# Patient Record
Sex: Female | Born: 1975 | Race: Black or African American | Hispanic: No | Marital: Married | Smoking: Never smoker
Health system: Southern US, Community
[De-identification: ages and names within clinical notes are randomized; demographics above are authoritative.]

## PROBLEM LIST (undated history)

## (undated) DIAGNOSIS — M329 Systemic lupus erythematosus, unspecified: Secondary | ICD-10-CM

## (undated) DIAGNOSIS — F32A Depression, unspecified: Secondary | ICD-10-CM

## (undated) DIAGNOSIS — E669 Obesity, unspecified: Secondary | ICD-10-CM

## (undated) DIAGNOSIS — F329 Major depressive disorder, single episode, unspecified: Secondary | ICD-10-CM

## (undated) DIAGNOSIS — I10 Essential (primary) hypertension: Secondary | ICD-10-CM

## (undated) HISTORY — DX: Systemic lupus erythematosus, unspecified: M32.9

## (undated) HISTORY — DX: Essential (primary) hypertension: I10

## (undated) HISTORY — DX: Obesity, unspecified: E66.9

## (undated) HISTORY — DX: Depression, unspecified: F32.A

## (undated) HISTORY — PX: ANKLE SURGERY: SHX546

---
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
## 2008-12-10 ENCOUNTER — Emergency Department (HOSPITAL_COMMUNITY): Admission: EM | Admit: 2008-12-10 | Discharge: 2008-12-11 | Payer: Self-pay | Admitting: Emergency Medicine

## 2010-05-06 ENCOUNTER — Emergency Department (HOSPITAL_COMMUNITY): Admission: EM | Admit: 2010-05-06 | Discharge: 2010-05-06 | Payer: Self-pay | Admitting: Emergency Medicine

## 2010-05-07 ENCOUNTER — Inpatient Hospital Stay (HOSPITAL_COMMUNITY): Admission: AD | Admit: 2010-05-07 | Discharge: 2010-05-09 | Payer: Self-pay | Admitting: Orthopaedic Surgery

## 2010-11-19 LAB — URINALYSIS, ROUTINE W REFLEX MICROSCOPIC
Glucose, UA: NEGATIVE mg/dL
Nitrite: NEGATIVE
Protein, ur: NEGATIVE mg/dL
Urobilinogen, UA: 1 mg/dL (ref 0.0–1.0)
pH: 6 (ref 5.0–8.0)

## 2010-11-19 LAB — CBC
HCT: 37.9 % (ref 36.0–46.0)
Hemoglobin: 12.9 g/dL (ref 12.0–15.0)
Platelets: 195 10*3/uL (ref 150–400)
RBC: 4.07 MIL/uL (ref 3.87–5.11)
RDW: 12.7 % (ref 11.5–15.5)

## 2010-11-19 LAB — DIFFERENTIAL
Basophils Absolute: 0 10*3/uL (ref 0.0–0.1)
Basophils Relative: 0 % (ref 0–1)
Eosinophils Relative: 1 % (ref 0–5)
Lymphs Abs: 1.5 10*3/uL (ref 0.7–4.0)
Monocytes Absolute: 0.9 10*3/uL (ref 0.1–1.0)
Monocytes Relative: 12 % (ref 3–12)

## 2010-11-19 LAB — COMPREHENSIVE METABOLIC PANEL
ALT: 44 U/L — ABNORMAL HIGH (ref 0–35)
Alkaline Phosphatase: 56 U/L (ref 39–117)
BUN: 8 mg/dL (ref 6–23)
CO2: 25 mEq/L (ref 19–32)
Chloride: 107 mEq/L (ref 96–112)
Creatinine, Ser: 0.8 mg/dL (ref 0.4–1.2)
GFR calc Af Amer: >60 mL/min (ref >60)
Total Bilirubin: 1.1 mg/dL (ref 0.3–1.2)

## 2010-11-19 LAB — PROTIME-INR
INR: 0.95 (ref 0.00–1.49)
Prothrombin Time: 12.9 seconds (ref 11.6–15.2)

## 2012-04-14 ENCOUNTER — Emergency Department (HOSPITAL_COMMUNITY)
Admission: EM | Admit: 2012-04-14 | Discharge: 2012-04-14 | Disposition: A | Discharge status: Home / Self Care | Payer: BC Managed Care – PPO | Attending: Emergency Medicine | Admitting: Emergency Medicine

## 2012-04-14 ENCOUNTER — Encounter (HOSPITAL_COMMUNITY): Payer: Self-pay | Admitting: Emergency Medicine

## 2012-04-14 DIAGNOSIS — L03319 Cellulitis of trunk, unspecified: Secondary | ICD-10-CM | POA: Insufficient documentation

## 2012-04-14 DIAGNOSIS — L02211 Cutaneous abscess of abdominal wall: Secondary | ICD-10-CM

## 2012-04-14 DIAGNOSIS — L02219 Cutaneous abscess of trunk, unspecified: Secondary | ICD-10-CM | POA: Insufficient documentation

## 2012-04-14 MED ORDER — OXYCODONE-ACETAMINOPHEN 5-325 MG PO TABS: 1.0000 | ORAL_TABLET | ORAL | Status: AC | PRN

## 2012-04-14 MED ORDER — IBUPROFEN 800 MG PO TABS
800.0000 mg | ORAL_TABLET | Freq: Once | ORAL | Status: AC
  Administered 2012-04-14: 800 mg via ORAL
  Filled 2012-04-14: qty 1

## 2012-04-14 NOTE — ED Notes (Signed)
Has not taken BP med yet today -- brought with her, will take now.   Also states most antibiotics give yeast infection -

## 2012-04-14 NOTE — ED Provider Notes (Signed)
Medical screening examination/treatment/procedure(s) were performed by non-physician practitioner and as supervising physician I was immediately available for consultation/collaboration.  Flint Melter, MD 04/14/12 [phone]

## 2012-04-14 NOTE — ED Notes (Signed)
Pt presenting to ed with c/o abscess to lower abdomen x 4 days pt states pain is worse with walking.

## 2012-04-14 NOTE — ED Notes (Signed)
Went into obtain current VS, pt is currently having ID procedure done. Will complete VS upon physician finishing with pt

## 2012-04-14 NOTE — ED Provider Notes (Signed)
History     CSN: [id number]  Arrival date & time 04/14/12  7829   First MD Initiated Contact with Patient 04/14/12 [phone]      Chief Complaint  Patient presents with  . Abscess    (Consider location/radiation/quality/duration/timing/severity/associated sxs/prior treatment) Patient is a 36 y.o. female presenting with abscess. The history is provided by the patient.  Abscess  This is a new problem. The current episode started less than one week ago. The abscess is present on the abdomen. Pertinent negatives include no fever. Associated symptoms comments: Painful swelling to lower abdomen. No history of abscess. No drainage, fever, nausea. .    History reviewed. No pertinent past medical history.  Past Surgical History  Procedure Date  . Ankle surgery     No family history on file.  History  Substance Use Topics  . Smoking status: Never Smoker   . Smokeless tobacco: Not on file  . Alcohol Use: No    OB History    Grav Para Term Preterm Abortions TAB SAB Ect Mult Living                  Review of Systems  Constitutional: Negative for fever.  Musculoskeletal: Negative.   Skin:       See HPI.    Allergies  Review of patient's allergies indicates no known allergies.  Home Medications   Current Outpatient Rx  Name Route Sig Dispense Refill  . LISINOPRIL 10 MG PO TABS Oral Take 10 mg by mouth daily.      BP 172/107  Pulse 96  Temp 98.5 F (36.9 C) (Oral)  Resp 20  SpO2 100%  LMP 04/13/2012  Physical Exam  Constitutional: She is oriented to person, place, and time. She appears well-developed and well-nourished.  Abdominal: Soft. There is no tenderness. There is no rebound and no guarding.       Tender limited to focal area below umbilicus. See skin exam.  Neurological: She is alert and oriented to person, place, and time.  Skin:       Lower abdominal wall has area of redness measuring approximately 6 cm with central area of fluctuance. Findings c/w  cutaneous abscess.    ED Course  Procedures (including critical care time)  Labs Reviewed - No data to display No results found. INCISION AND DRAINAGE Performed by: Langley Adie A Consent: Verbal consent obtained. Risks and benefits: risks, benefits and alternatives were discussed Type: abscess  Body area: lower abdominal wall  Anesthesia: local infiltration  Local anesthetic: lidocaine 1% w/o epinephrine  Anesthetic total: 2 ml  Complexity: complex Blunt dissection to break up loculations  Drainage: purulent  Drainage amount: large  Packing material: 1/4 in iodoform gauze  Patient tolerance: Patient tolerated the procedure well with no immediate complications.     No diagnosis found. 1. Cutaneous abscess    MDM  Abscess  Opened and drained without complication. Patient instructed to return for 2 day recheck.        Rodena Medin, PA-C 04/14/12 1141

## 2019-05-30 ENCOUNTER — Ambulatory Visit: Payer: 59 | Admitting: Podiatry

## 2019-06-07 ENCOUNTER — Emergency Department (HOSPITAL_COMMUNITY)
Admission: EM | Admit: 2019-06-07 | Discharge: 2019-06-07 | Disposition: A | Discharge status: Home / Self Care | Payer: 59 | Attending: Emergency Medicine | Admitting: Emergency Medicine

## 2019-06-07 ENCOUNTER — Emergency Department (HOSPITAL_COMMUNITY): Payer: 59

## 2019-06-07 ENCOUNTER — Other Ambulatory Visit: Payer: Self-pay

## 2019-06-07 ENCOUNTER — Encounter (HOSPITAL_COMMUNITY): Payer: Self-pay | Admitting: Emergency Medicine

## 2019-06-07 DIAGNOSIS — I10 Essential (primary) hypertension: Secondary | ICD-10-CM

## 2019-06-07 DIAGNOSIS — R0602 Shortness of breath: Secondary | ICD-10-CM | POA: Diagnosis present

## 2019-06-07 DIAGNOSIS — Z79899 Other long term (current) drug therapy: Secondary | ICD-10-CM | POA: Insufficient documentation

## 2019-06-07 LAB — CBC WITH DIFFERENTIAL/PLATELET
Abs Immature Granulocytes: 0.01 10*3/uL (ref 0.00–0.07)
Basophils Absolute: 0 10*3/uL (ref 0.0–0.1)
Basophils Relative: 0 %
Eosinophils Absolute: 0.1 10*3/uL (ref 0.0–0.5)
Eosinophils Relative: 3 %
HCT: 37.5 % (ref 36.0–46.0)
Hemoglobin: 11.9 g/dL — ABNORMAL LOW (ref 12.0–15.0)
Immature Granulocytes: 0 %
Lymphocytes Relative: 27 %
Lymphs Abs: 1.1 10*3/uL (ref 0.7–4.0)
MCH: 31.4 pg (ref 26.0–34.0)
MCHC: 31.7 g/dL (ref 30.0–36.0)
MCV: 98.9 fL (ref 80.0–100.0)
Monocytes Absolute: 0.4 10*3/uL (ref 0.1–1.0)
Monocytes Relative: 9 %
Neutro Abs: 2.5 10*3/uL (ref 1.7–7.7)
Neutrophils Relative %: 61 %
Platelets: 225 10*3/uL (ref 150–400)
RBC: 3.79 MIL/uL — ABNORMAL LOW (ref 3.87–5.11)
RDW: 12.4 % (ref 11.5–15.5)
WBC: 4.1 10*3/uL (ref 4.0–10.5)
nRBC: 0 % (ref 0.0–0.2)

## 2019-06-07 LAB — BASIC METABOLIC PANEL
Anion gap: 3 — ABNORMAL LOW (ref 5–15)
BUN: 17 mg/dL (ref 6–20)
CO2: 30 mmol/L (ref 22–32)
Calcium: 8.7 mg/dL — ABNORMAL LOW (ref 8.9–10.3)
Chloride: 107 mmol/L (ref 98–111)
Creatinine, Ser: 0.73 mg/dL (ref 0.44–1.00)
GFR calc Af Amer: >60 mL/min (ref >60)
GFR calc non Af Amer: >60 mL/min (ref >60)
Glucose, Bld: 101 mg/dL — ABNORMAL HIGH (ref 70–99)
Potassium: 3.4 mmol/L — ABNORMAL LOW (ref 3.5–5.1)
Sodium: 140 mmol/L (ref 135–145)

## 2019-06-07 LAB — HCG, QUANTITATIVE, PREGNANCY: hCG, Beta Chain, Quant, S: <1 m[IU]/mL (ref <5)

## 2019-06-07 LAB — TROPONIN I (HIGH SENSITIVITY): Troponin I (High Sensitivity): 2 ng/L (ref <18)

## 2019-06-07 MED ORDER — HYDROCHLOROTHIAZIDE 25 MG PO TABS: 25.0000 mg | ORAL_TABLET | Freq: Every day | ORAL | 0 refills | Status: DC

## 2019-06-07 NOTE — ED Provider Notes (Signed)
[facility] EMERGENCY DEPARTMENT Provider Note   CSN: [id number] Arrival date & time: 06/07/19  1140     History   Chief Complaint Chief Complaint  Patient presents with   Shortness of Breath    HPI Samantha Bender is a 43 y.o. female with a history of hypertension, presenting for evaluation of shortness of breath, headache and chest pain.  She reports waking up around 3 AM last night with a sensation of shortness of breath without wheezing or chest pain.  She "made herself" go back to sleep, and she woke this morning around 9 AM she had another episode of feeling short of breath which lasted about 30 minutes then improved.  She does endorse having a generalized headache since last night as well.  She denies fevers or chills, nausea, vomiting, diaphoresis.  She also denies abdominal pain dizziness, focal weakness.  As she was driving here for evaluation she had a fleeting episode of right sided chest pressure which was fleeting and has resolved.  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  She does not smoke, she does have a family history of early cardiac disease in her father.  She denies COVID exposures, in fact was screened 4 days ago with a negative test.  She does endorse persistently elevated blood pressures despite being compliant with her lisinopril.  She has had no edema or pain in her lower extremities.  She is currently symptom-free.  HPI: A 43 year old patient with a history of hypertension and obesity presents for evaluation of chest pain. Initial onset of pain was approximately 3-6 hours ago. The patient's chest pain is sharp and is not worse with exertion. The patient's chest pain is not middle- or left-sided, is not well-localized, is not described as heaviness/pressure/tightness and does not radiate to the arms/jaw/neck. The patient does not complain of nausea and denies diaphoresis. The patient has a family history of coronary artery disease in a first-degree  relative with onset less than age 30. The patient has no history of stroke, has no history of peripheral artery disease, has not smoked in the past 90 days, denies any history of treated diabetes and has no history of hypercholesterolemia.   The history is provided by the patient.    History reviewed. No pertinent past medical history.  There are no active problems to display for this patient.   Past Surgical History:  Procedure Laterality Date   ANKLE SURGERY       OB History   No obstetric history on file.      Home Medications    Prior to Admission medications   Medication Sig Start Date End Date Taking? Authorizing Provider  ibuprofen (ADVIL) 800 MG tablet Take 800 mg by mouth every 8 (eight) hours as needed for cramping.   Yes [provider]  lisinopril (PRINIVIL,ZESTRIL) 10 MG tablet Take 40 mg by mouth daily.    Yes [provider]  hydrochlorothiazide (HYDRODIURIL) 25 MG tablet Take 1 tablet (25 mg total) by mouth daily. 06/07/19   Burgess Amor, PA-C    Family History No family history on file.  Social History Social History   Tobacco Use   Smoking status: Never Smoker   Smokeless tobacco: Never Used  Substance Use Topics   Alcohol use: No   Drug use: No     Allergies   Patient has no known allergies.   Review of Systems Review of Systems  Constitutional: Negative for chills and fever.  HENT: Negative for congestion and sore throat.   Eyes: Negative.   Respiratory: Positive for shortness of breath. Negative for cough, chest tightness and wheezing.   Cardiovascular: Positive for chest pain.  Gastrointestinal: Negative for abdominal pain, nausea and vomiting.  Genitourinary: Negative.   Musculoskeletal: Negative for arthralgias, joint swelling and neck pain.  Skin: Negative.  Negative for rash and wound.  Neurological: Negative for dizziness, weakness, light-headedness, numbness and headaches.  Psychiatric/Behavioral: Negative.       Physical Exam Updated Vital Signs BP (!) 172/117 (BP Location: Right Arm)    Pulse 73    Temp 98.2 F (36.8 C) (Oral)    Resp 16    Ht 5\' 6"  (1.676 m)    Wt 117.9 kg    LMP 05/31/2019    SpO2 99%    BMI 41.97 kg/m   Physical Exam Vitals signs and nursing note reviewed.  Constitutional:      General: She is not in acute distress.    Appearance: She is well-developed. She is obese.  HENT:     Head: Normocephalic and atraumatic.  Eyes:     Conjunctiva/sclera: Conjunctivae normal.  Neck:     Musculoskeletal: Normal range of motion.  Cardiovascular:     Rate and Rhythm: Normal rate and regular rhythm.     Heart sounds: Normal heart sounds.  Pulmonary:     Effort: Pulmonary effort is normal.     Breath sounds: Normal breath sounds. No decreased breath sounds, wheezing or rhonchi.  Abdominal:     General: Bowel sounds are normal.     Palpations: Abdomen is soft.     Tenderness: There is no abdominal tenderness.  Musculoskeletal: Normal range of motion.     Right lower leg: She exhibits no tenderness. No edema.     Left lower leg: She exhibits no tenderness. No edema.  Skin:    General: Skin is warm and dry.  Neurological:     General: No focal deficit present.     Mental Status: She is alert.      ED Treatments / Results  Labs (all labs ordered are listed, but only abnormal results are displayed) Labs Reviewed  BASIC METABOLIC PANEL - Abnormal; Notable for the following components:      Result Value   Potassium 3.4 (*)    Glucose, Bld 101 (*)    Calcium 8.7 (*)    Anion gap 3 (*)    All other components within normal limits  CBC WITH DIFFERENTIAL/PLATELET - Abnormal; Notable for the following components:   RBC 3.79 (*)    Hemoglobin 11.9 (*)    All other components within normal limits  HCG, QUANTITATIVE, PREGNANCY  TROPONIN I (HIGH SENSITIVITY)  TROPONIN I (HIGH SENSITIVITY)    EKG EKG Interpretation  Date/Time:  Thursday June 07 2019 12:02:13  EDT Ventricular Rate:  72 PR Interval:  182 QRS Duration: 68 QT Interval:  388 QTC Calculation: 424 R Axis:   56 Text Interpretation:  Normal sinus rhythm Low voltage QRS Possible Lateral infarct , age undetermined Abnormal ECG similar to prior 9/11 Confirmed by Aletta Edouard [phone]) on 06/07/2019 12:10:59 PM   Radiology Dg Chest 2 View  Result Date: 06/07/2019 CLINICAL DATA:  Shortness of breath EXAM: CHEST - 2 VIEW COMPARISON:  05/07/10 FINDINGS: The heart size and mediastinal contours are within normal limits. Both lungs are clear. The visualized skeletal structures are unremarkable. IMPRESSION: No active cardiopulmonary disease. Electronically Signed   By: Inez Catalina  M.D.   On: 06/07/2019 13:25    Procedures Procedures (including critical care time)  Medications Ordered in ED Medications - No data to display   Initial Impression / Assessment and Plan / ED Course  I have reviewed the triage vital signs and the nursing notes.  Pertinent labs & imaging results that were available during my care of the patient were reviewed by me and considered in my medical decision making (see chart for details).     HEAR Score: 2  Pt with low heart score and low troponin at 2 ng/L.  No indication for admission or delta troponin.  She has remained hypertensive here but sx free.  She has been compliant with her lisinopril.  Will add hctz for additional bp control.  Advised f/u with pcp (she reports appt in Nov), advised blood recheck within 1 week.    Final Clinical Impressions(s) / ED Diagnoses   Final diagnoses:  Essential hypertension    ED Discharge Orders         Ordered    hydrochlorothiazide (HYDRODIURIL) 25 MG tablet  Daily     06/07/19 1621           Burgess Amordol, Percy Winterrowd, PA-C 06/07/19 1632    Sabas SousBero, Michael M, MD 06/10/19 1250

## 2019-06-07 NOTE — ED Triage Notes (Signed)
Pt c/o of sob and headache since last night.  States its better but wants to be evaulated

## 2019-06-07 NOTE — Discharge Instructions (Addendum)
Your lab tests, ekg and chest xray are reassuring.  Your blood pressure has been elevated here and I suspect you would benefit from additional medicine to better control your pressure.  This is a diuretic medicine that will cause increased urination until your body adjusts.  It can also lower your bodies potassium level.  You can counteract this with diet - foods rich in potassium include bananas, orange juice and leafy green vegetables.

## 2019-07-11 ENCOUNTER — Other Ambulatory Visit: Payer: Self-pay

## 2019-07-11 ENCOUNTER — Ambulatory Visit
Admission: EM | Admit: 2019-07-11 | Discharge: 2019-07-11 | Disposition: A | Discharge status: Home / Self Care | Payer: 59 | Attending: Emergency Medicine | Admitting: Emergency Medicine

## 2019-07-11 ENCOUNTER — Ambulatory Visit (INDEPENDENT_AMBULATORY_CARE_PROVIDER_SITE_OTHER): Payer: 59

## 2019-07-11 DIAGNOSIS — S99922A Unspecified injury of left foot, initial encounter: Secondary | ICD-10-CM

## 2019-07-11 DIAGNOSIS — S92515A Nondisplaced fracture of proximal phalanx of left lesser toe(s), initial encounter for closed fracture: Secondary | ICD-10-CM

## 2019-07-11 MED ORDER — TRAMADOL HCL 50 MG PO TABS: 50.0000 mg | ORAL_TABLET | Freq: Two times a day (BID) | ORAL | 0 refills | Status: DC | PRN

## 2019-07-11 MED ORDER — NAPROXEN 500 MG PO TABS: 500.0000 mg | ORAL_TABLET | Freq: Two times a day (BID) | ORAL | 0 refills | Status: DC

## 2019-07-11 NOTE — ED Provider Notes (Signed)
[facility] CARE CENTER   [id number] 07/11/19 Arrival Time: 1312  CC: Left foot pain  SUBJECTIVE: History from: patient. Sorina Derrig is a 43 y.o. female complains of left foot/toe pain that began last night.  States foot got [employer] [place].  Localizes the pain to the top and outside of foot, as well as middle to outside toes.  Describes the pain as constant and throbbing in character.  Pain is 8/10.  Has tried OTC tylenol and wearing crocs with minimal relief.  Symptoms are made worse with bearing weight.  Denies similar symptoms in the past.  Complains of associated swelling and bruising. Denies fever, chills, erythema, ecchymosis, effusion, weakness, numbness and tingling     ROS: As per HPI.  All other pertinent ROS negative.     History reviewed. No pertinent past medical history. Past Surgical History:  Procedure Laterality Date  . ANKLE SURGERY     No Known Allergies No current facility-administered medications on file prior to encounter.    Current Outpatient Medications on File Prior to Encounter  Medication Sig Dispense Refill  . hydrochlorothiazide (HYDRODIURIL) 25 MG tablet Take 1 tablet (25 mg total) by mouth daily. 30 tablet 0  . ibuprofen (ADVIL) 800 MG tablet Take 800 mg by mouth every 8 (eight) hours as needed for cramping.    Marland Kitchen lisinopril (PRINIVIL,ZESTRIL) 10 MG tablet Take 40 mg by mouth daily.      Social History   Socioeconomic History  . Marital status: Married    Spouse name: Not on file  . Number of children: Not on file  . Years of education: Not on file  . Highest education level: Not on file  Occupational History  . Not on file  Social Needs  . Financial resource strain: Not on file  . Food insecurity    Worry: Not on file    Inability: Not on file  . Transportation needs    Medical: Not on file    Non-medical: Not on file  Tobacco Use  . Smoking status: Never Smoker  . Smokeless tobacco: Never Used  Substance and Sexual Activity   . Alcohol use: No  . Drug use: No  . Sexual activity: Not on file  Lifestyle  . Physical activity    Days per week: Not on file    Minutes per session: Not on file  . Stress: Not on file  Relationships  . Social Musician on phone: Not on file    Gets together: Not on file    Attends religious service: Not on file    Active member of club or organization: Not on file    Attends meetings of clubs or organizations: Not on file    Relationship status: Not on file  . Intimate partner violence    Fear of current or ex partner: Not on file    Emotionally abused: Not on file    Physically abused: Not on file    Forced sexual activity: Not on file  Other Topics Concern  . Not on file  Social History Narrative  . Not on file   Family History  Problem Relation Age of Onset  . Diabetes Mother   . Cancer Father     OBJECTIVE:  Vitals:   07/11/19 1321  BP: (!) 163/108  Pulse: 76  Resp: 20  Temp: 98.4 F (36.9 C)  SpO2: 95%    General appearance: ALERT; in no acute distress.  Head: NCAT Lungs: Normal  respiratory effort CV: Dorsalis pedis pulses 2+. Cap refill < 2 seconds Musculoskeletal: Left foot Inspection: Ecchymosis over 3rd - 5th digits of the left foot Palpation: TTP over distal 3rd proximal phalanx, and 3rd-5th distal phalanges ROM: FROM active and passive Strength: 5/5 dorsiflexion, 5/5 plantar flexion Skin: warm and dry Neurologic: Ambulates with minimal difficulty; Sensation intact about the lower extremities Psychological: alert and cooperative; normal mood and affect  DIAGNOSTIC STUDIES:  Dg Foot Complete Left  Result Date: 07/11/2019 CLINICAL DATA:  43 year old female with a history of foot injury EXAM: LEFT FOOT - COMPLETE 3+ VIEW COMPARISON:  05/06/2010 FINDINGS: Acute nondisplaced fracture of the proximal phalanx of the third digit. Remote surgical changes of prior open reduction internal fixation of distal tibia and fibula. No hardware  fracture. No unexpected radiopaque foreign body. No unexpected soft tissue density. Evidence of posttraumatic degenerative changes at the tibiotalar joint, not well evaluated on this foot plain film series. IMPRESSION: Acute nondisplaced fracture of the proximal phalanx of the third digit left foot. 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 Electronically Signed   By: Corrie Mckusick D.O.   On: 07/11/2019 13:48     X-rays positive for third proximal phalanx fracture.  Nondisplaced.    I have reviewed the x-rays myself and the radiologist interpretation. I am in agreement with the radiologist interpretation.     ASSESSMENT & PLAN:  1. Closed nondisplaced fracture of proximal phalanx of lesser toe of left foot, initial encounter   2. Injury of left foot, initial encounter   3. Injury of toe on left foot, initial encounter     Meds ordered this encounter  Medications  . naproxen (NAPROSYN) 500 MG tablet    Sig: Take 1 tablet (500 mg total) by mouth 2 (two) times daily.    Dispense:  30 tablet    Refill:  0    Order Specific Question:   Supervising Provider    Answer:   Raylene Everts [[id number]]  . traMADol (ULTRAM) 50 MG tablet    Sig: Take 1 tablet (50 mg total) by mouth every 12 (twelve) hours as needed for severe pain.    Dispense:  10 tablet    Refill:  0    Order Specific Question:   Supervising Provider    Answer:   Raylene Everts [[id number]]   X-rays did show third proximal phalanx fracture Continue conservative management of rest, ice, and elevation Post-op shoe and crutches given.  Wear post-op shoe until clear by orthopedist.  Use crutches as needed for comfort.  Progress weight-bearing activities as tolerated Take naproxen as needed for pain relief (may cause abdominal discomfort, ulcers, and GI bleeds avoid taking with other NSAIDs) Tramadol prescribed  for severe break-through pain.  DO NOT TAKE PRIOR TO driving or operating heavy machinery.   Follow up with orthopedist for further evaluation and management Return or go to the ER if you have any new or worsening symptoms (fever, chills, increased redness, swelling, bruising, symptoms do not improve with medications and treatment, etc...)   Reviewed expectations re: course of current medical issues. Questions answered. Outlined signs and symptoms indicating need for more acute intervention. Patient verbalized understanding. After Visit Summary given.    Lestine Box, PA-C 07/11/19 1357

## 2019-07-11 NOTE — Discharge Instructions (Addendum)
X-rays did show third proximal phalanx fracture Continue conservative management of rest, ice, and elevation Post-op shoe and crutches given.  Wear post-op shoe until clear by orthopedist.  Use crutches as needed for comfort.  Progress weight-bearing activities as tolerated Take naproxen as needed for pain relief (may cause abdominal discomfort, ulcers, and GI bleeds avoid taking with other NSAIDs) Tramadol prescribed for severe break-through pain.  DO NOT TAKE PRIOR TO driving or operating heavy machinery.   Follow up with orthopedist for further evaluation and management Return or go to the ER if you have any new or worsening symptoms (fever, chills, increased redness, swelling, bruising, symptoms do not improve with medications and treatment, etc...)

## 2019-07-11 NOTE — ED Triage Notes (Signed)
Pt presents  With c/o left toe injury from bumping on furniture last night, bruising noted

## 2019-07-12 ENCOUNTER — Telehealth: Payer: Self-pay | Admitting: Emergency Medicine

## 2019-07-12 MED ORDER — HYDROCODONE-ACETAMINOPHEN 5-325 MG PO TABS: 1.0000 | ORAL_TABLET | Freq: Two times a day (BID) | ORAL | 0 refills | Status: DC | PRN

## 2019-07-12 NOTE — Telephone Encounter (Signed)
Patient calls in requesting strong pain medication.  Tried taking two tramadol yesterday without relief.  Five norco sent to pharmacy on file.  Instructed to take for severe break-through pain.  Recommended to discard remaining tramadol.

## 2019-07-13 ENCOUNTER — Telehealth: Payer: Self-pay | Admitting: Urgent Care

## 2019-07-13 MED ORDER — HYDROCODONE-ACETAMINOPHEN 5-325 MG PO TABS: 1.0000 | ORAL_TABLET | Freq: Two times a day (BID) | ORAL | 0 refills | Status: DC | PRN

## 2019-07-13 NOTE — Telephone Encounter (Signed)
Patient called requesting hydrocodone be sent to [facility] in [place].  Per chart review patient was given this for pain from a metatarsal fracture.  Will send prescription now.  [facility] database reviewed and has not filled hydrocodone anywhere else from prescription sent previously by [facility].

## 2019-08-07 ENCOUNTER — Ambulatory Visit
Admission: EM | Admit: 2019-08-07 | Discharge: 2019-08-07 | Disposition: A | Discharge status: Home / Self Care | Payer: 59 | Attending: Emergency Medicine | Admitting: Emergency Medicine

## 2019-08-07 ENCOUNTER — Other Ambulatory Visit: Payer: Self-pay

## 2019-08-07 DIAGNOSIS — R234 Changes in skin texture: Secondary | ICD-10-CM

## 2019-08-07 DIAGNOSIS — R030 Elevated blood-pressure reading, without diagnosis of hypertension: Secondary | ICD-10-CM

## 2019-08-07 MED ORDER — DOXYCYCLINE HYCLATE 100 MG PO CAPS: 100.0000 mg | ORAL_CAPSULE | Freq: Two times a day (BID) | ORAL | 0 refills | Status: DC

## 2019-08-07 NOTE — ED Triage Notes (Signed)
Pt states she has areas of large bumps that has developed on buttocks  At panty line

## 2019-08-07 NOTE — ED Provider Notes (Signed)
[place]   [id number] 08/07/19 Arrival Time: 0973   CC: ABSCESS  SUBJECTIVE:  Shantoya Geurts is a 43 y.o. female who presents with a possible abscess of her buttock x few weeks ago.  Symptoms began after going to the beach.  Reports mild pain with sitting, but denies itching.  Denies aggravating or alleviating factors. Denies previous symptoms in the past.  Denies fever, chills, nausea, vomiting, erythema, swelling, discharge, odor.    ROS: As per HPI.  All other pertinent ROS negative.     History reviewed. No pertinent past medical history. Past Surgical History:  Procedure Laterality Date  . ANKLE SURGERY     No Known Allergies No current facility-administered medications on file prior to encounter.    Current Outpatient Medications on File Prior to Encounter  Medication Sig Dispense Refill  . hydrochlorothiazide (HYDRODIURIL) 25 MG tablet Take 1 tablet (25 mg total) by mouth daily. 30 tablet 0  . ibuprofen (ADVIL) 800 MG tablet Take 800 mg by mouth every 8 (eight) hours as needed for cramping.    Marland Kitchen lisinopril (PRINIVIL,ZESTRIL) 10 MG tablet Take 40 mg by mouth daily.     . naproxen (NAPROSYN) 500 MG tablet Take 1 tablet (500 mg total) by mouth 2 (two) times daily. 30 tablet 0   Social History   Socioeconomic History  . Marital status: Married    Spouse name: Not on file  . Number of children: Not on file  . Years of education: Not on file  . Highest education level: Not on file  Occupational History  . Not on file  Social Needs  . Financial resource strain: Not on file  . Food insecurity    Worry: Not on file    Inability: Not on file  . Transportation needs    Medical: Not on file    Non-medical: Not on file  Tobacco Use  . Smoking status: Never Smoker  . Smokeless tobacco: Never Used  Substance and Sexual Activity  . Alcohol use: No  . Drug use: No  . Sexual activity: Not on file  Lifestyle  . Physical activity    Days per week: Not on file    Minutes per session: Not on file  . Stress: Not on file  Relationships  . Social Herbalist on phone: Not on file    Gets together: Not on file    Attends religious service: Not on file    Active member of club or organization: Not on file    Attends meetings of clubs or organizations: Not on file    Relationship status: Not on file  . Intimate partner violence    Fear of current or ex partner: Not on file    Emotionally abused: Not on file    Physically abused: Not on file    Forced sexual activity: Not on file  Other Topics Concern  . Not on file  Social History Narrative  . Not on file   Family History  Problem Relation Age of Onset  . Diabetes Mother   . Cancer Father     OBJECTIVE:  Vitals:   08/07/19 1024  BP: (!) 167/105  Pulse: 69  Resp: 20  Temp: 98.4 F (36.9 C)  SpO2: 96%     General appearance: alert; no distress Lungs: Normal respiratory effort; CTAB CV: RRR Skin: area of induration approximately 3- 4 cm in length localized to bilateral gluteal folds; mildly tender to touch; no active drainage,  or overlying erythema Psychological: alert and cooperative; normal mood and affect   ASSESSMENT & PLAN:  1. Induration of skin   2. Elevated blood pressure reading     Meds ordered this encounter  Medications  . doxycycline (VIBRAMYCIN) 100 MG capsule    Sig: Take 1 capsule (100 mg total) by mouth 2 (two) times daily.    Dispense:  20 capsule    Refill:  0    Order Specific Question:   Supervising Provider    Answer:   Eustace Moore [[id number]]   Will treat for possible abscess/ infected cyst Apply warm compresses 3-4x daily for 10-15 minutes Wash site daily with warm water and mild soap Keep covered to avoid friction Take antibiotic as prescribed and to completion If symptoms do not improve over the next 2-3 days please follow up with PCP for further evaluation and management.  You may benefit from additional imaging such as [place]  Return or go to the ED if you have any new or worsening symptoms increased redness, swelling, pain, nausea, vomiting, fever, chills, etc...   Blood pressure elevated in office.  Please recheck in 24 hours.  If it continues to be greater than 140/90 please follow up with PCP for further evaluation and management.     Reviewed expectations re: course of current medical issues. Questions answered. Outlined signs and symptoms indicating need for more acute intervention. Patient verbalized understanding. After Visit Summary given.          [name], PA-C 08/07/19 1052

## 2019-08-07 NOTE — Discharge Instructions (Signed)
Will treat for possible abscess/ infected cyst Apply warm compresses 3-4x daily for 10-15 minutes Wash site daily with warm water and mild soap Keep covered to avoid friction Take antibiotic as prescribed and to completion If symptoms do not improve over the next 2-3 days please follow up with PCP for further evaluation and management.  You may benefit from additional imaging such as [place] Return or go to the ED if you have any new or worsening symptoms increased redness, swelling, pain, nausea, vomiting, fever, chills, etc..Marland Kitchen

## 2019-10-13 ENCOUNTER — Ambulatory Visit
Admission: EM | Admit: 2019-10-13 | Discharge: 2019-10-13 | Disposition: A | Discharge status: Home / Self Care | Payer: Managed Care, Other (non HMO)

## 2019-10-13 ENCOUNTER — Other Ambulatory Visit: Payer: Self-pay

## 2019-10-13 DIAGNOSIS — R220 Localized swelling, mass and lump, head: Secondary | ICD-10-CM

## 2019-10-13 DIAGNOSIS — L0201 Cutaneous abscess of face: Secondary | ICD-10-CM

## 2019-10-13 MED ORDER — CEFTRIAXONE SODIUM 1 G IJ SOLR
1.0000 g | Freq: Once | INTRAMUSCULAR | Status: AC
  Administered 2019-10-13: 14:00:00 1 g via INTRAMUSCULAR

## 2019-10-13 MED ORDER — DOXYCYCLINE HYCLATE 100 MG PO CAPS: 100.0000 mg | ORAL_CAPSULE | Freq: Two times a day (BID) | ORAL | 0 refills | Status: DC

## 2019-10-13 NOTE — ED Provider Notes (Signed)
[facility]   [id number] 10/13/19 Arrival Time: 1332   CC: RT side facial swelling  SUBJECTIVE:  Cande Mastropietro is a 44 y.o. female who presents with a possible abscess of her RT side of face x 2 days.  Denies precipitating event or trauma.  Has tried ibuprofen without relief.  Worse to the touch.  Denies similar symptoms in the past.  Complains of mild RT sided ear pain, rhinorrhea, congestion, and PND, improved today.  Denies fever, chills, nausea, vomiting, drainage, dyspnea, dysphagia, drooling.    ROS: As per HPI.  All other pertinent ROS negative.     History reviewed. No pertinent past medical history. Past Surgical History:  Procedure Laterality Date  . ANKLE SURGERY     No Known Allergies No current facility-administered medications on file prior to encounter.   Current Outpatient Medications on File Prior to Encounter  Medication Sig Dispense Refill  . BYSTOLIC 10 MG tablet Take 10 mg by mouth daily.    . clonazePAM (KLONOPIN) 1 MG tablet Take 1 mg by mouth daily as needed.    . hydrochlorothiazide (HYDRODIURIL) 25 MG tablet Take 1 tablet (25 mg total) by mouth daily. 30 tablet 0  . ibuprofen (ADVIL) 800 MG tablet Take 800 mg by mouth every 8 (eight) hours as needed for cramping.    Marland Kitchen lisinopril (PRINIVIL,ZESTRIL) 10 MG tablet Take 40 mg by mouth daily.     . naproxen (NAPROSYN) 500 MG tablet Take 1 tablet (500 mg total) by mouth 2 (two) times daily. 30 tablet 0   Social History   Socioeconomic History  . Marital status: Married    Spouse name: Not on file  . Number of children: Not on file  . Years of education: Not on file  . Highest education level: Not on file  Occupational History  . Not on file  Tobacco Use  . Smoking status: Never Smoker  . Smokeless tobacco: Never Used  Substance and Sexual Activity  . Alcohol use: No  . Drug use: No  . Sexual activity: Not on file  Other Topics Concern  . Not on file  Social History Narrative  . Not on  file   Social Determinants of Health   Financial Resource Strain:   . Difficulty of Paying Living Expenses: Not on file  Food Insecurity:   . Worried About Programme researcher, broadcasting/film/video in the Last Year: Not on file  . Ran Out of Food in the Last Year: Not on file  Transportation Needs:   . Lack of Transportation (Medical): Not on file  . Lack of Transportation (Non-Medical): Not on file  Physical Activity:   . Days of Exercise per Week: Not on file  . Minutes of Exercise per Session: Not on file  Stress:   . Feeling of Stress : Not on file  Social Connections:   . Frequency of Communication with Friends and Family: Not on file  . Frequency of Social Gatherings with Friends and Family: Not on file  . Attends Religious Services: Not on file  . Active Member of Clubs or Organizations: Not on file  . Attends Banker Meetings: Not on file  . Marital Status: Not on file  Intimate Partner Violence:   . Fear of Current or Ex-Partner: Not on file  . Emotionally Abused: Not on file  . Physically Abused: Not on file  . Sexually Abused: Not on file   Family History  Problem Relation Age of Onset  .  Diabetes Mother   . Cancer Father     OBJECTIVE:  Vitals:   10/13/19 1341 10/13/19 1348  BP: (!) 195/120 (!) 164/94  Pulse: 75   Resp: 18   Temp: 98.6 F (37 C)   SpO2: 99%      General appearance: alert; no distress HENT: NCAT; RT lateral cheek with 2 cm area of induration without overlying erythema or pustle, no bleeding or discharge; EACs clear, LT TM pearly gray, RT TM mildly injected; nares patent, RT inferior turbinate swollen and injected; oropharynx clear, inside of RT cheek with small area of erythema, no obvious abscess or dental caries CV: RRR Lungs: CTAB; normal respiratory effort Skin: warm and dry Psychological: alert and cooperative; normal mood and affect  ASSESSMENT & PLAN:  1. Facial abscess   2. Swelling of right side of face     Meds ordered this  encounter  Medications  . doxycycline (VIBRAMYCIN) 100 MG capsule    Sig: Take 1 capsule (100 mg total) by mouth 2 (two) times daily.    Dispense:  20 capsule    Refill:  0    Order Specific Question:   Supervising Provider    Answer:   Raylene Everts [[id number]]  . cefTRIAXone (ROCEPHIN) injection 1 g    Gram of rocephin given in office Apply warm compresses 3-4x daily for 10-15 minutes Take antibiotic as prescribed and to completion Follow up here tomorrow for repeat injection Return or go to the ED if you have any new or worsening symptoms increased redness, swelling, pain, nausea, vomiting, fever, chills, etc...    Reviewed expectations re: course of current medical issues. Questions answered. Outlined signs and symptoms indicating need for more acute intervention. Patient verbalized understanding. After Visit Summary given.          Lestine Box, PA-C 10/13/19 1411

## 2019-10-13 NOTE — Discharge Instructions (Signed)
Gram of rocephin given in office Apply warm compresses 3-4x daily for 10-15 minutes Take antibiotic as prescribed and to completion Follow up here tomorrow for repeat injection Return or go to the ED if you have any new or worsening symptoms increased redness, swelling, pain, nausea, vomiting, fever, chills, etc..Marland Kitchen

## 2019-10-14 ENCOUNTER — Other Ambulatory Visit: Payer: Self-pay

## 2019-10-14 ENCOUNTER — Ambulatory Visit
Admission: EM | Admit: 2019-10-14 | Discharge: 2019-10-14 | Disposition: A | Discharge status: Home / Self Care | Payer: Managed Care, Other (non HMO) | Attending: Emergency Medicine | Admitting: Emergency Medicine

## 2019-10-14 DIAGNOSIS — K1120 Sialoadenitis, unspecified: Secondary | ICD-10-CM

## 2019-10-14 DIAGNOSIS — R220 Localized swelling, mass and lump, head: Secondary | ICD-10-CM

## 2019-10-14 MED ORDER — HYDROCODONE-ACETAMINOPHEN 5-325 MG PO TABS: 1.0000 | ORAL_TABLET | Freq: Two times a day (BID) | ORAL | 0 refills | Status: DC | PRN

## 2019-10-14 MED ORDER — CEFTRIAXONE SODIUM 1 G IJ SOLR
1.0000 g | Freq: Once | INTRAMUSCULAR | Status: AC
  Administered 2019-10-14: 1 g via INTRAMUSCULAR

## 2019-10-14 MED ORDER — AMOXICILLIN-POT CLAVULANATE 875-125 MG PO TABS: 1.0000 | ORAL_TABLET | Freq: Two times a day (BID) | ORAL | 0 refills | Status: AC

## 2019-10-14 MED ORDER — KETOROLAC TROMETHAMINE 60 MG/2ML IM SOLN
60.0000 mg | Freq: Once | INTRAMUSCULAR | Status: AC
  Administered 2019-10-14: 60 mg via INTRAMUSCULAR

## 2019-10-14 MED ORDER — DEXAMETHASONE SODIUM PHOSPHATE 10 MG/ML IJ SOLN
10.0000 mg | Freq: Once | INTRAMUSCULAR | Status: AC
  Administered 2019-10-14: 10 mg via INTRAMUSCULAR

## 2019-10-14 MED ORDER — PREDNISONE 20 MG PO TABS: 20.0000 mg | ORAL_TABLET | Freq: Two times a day (BID) | ORAL | 0 refills | Status: AC

## 2019-10-14 NOTE — Discharge Instructions (Signed)
Toradol and decadron shots given in office for pain and swelling 1 gram rocephin given in office Apply warm compresses 3-4x daily for 10-15 minutes Continue to alternate ibuprofen and tylenol Norco for severe break-through pain. DO NOT TAKE prior to driving or operating heavy machinery Prednisone prescribed.  Take as directed and to completion Wash site daily with warm water and mild soap Continue with antibiotic as prescribed and to completion If symptoms do not improve or worsen over the next 24-48 hours go to the ED.  Worsening symptoms such as increased redness, swelling, pain, nausea, vomiting, fever, chills, difficulty breathing or swallowing, drooling, etc..Marland Kitchen

## 2019-10-14 NOTE — ED Provider Notes (Signed)
[place]   [id number] 10/14/19 Arrival Time: 5809   CC: Abscess check  SUBJECTIVE:  Samantha Bender is a 44 y.o. female who presents for recheck of facial abscess x 3 days.  Seen yesterday, dx'ed with facial abscess, and treated with 1 gram of rocephin and doxycycline.  Took 3 doses of doxycycline.  States symptoms have worsened.  Complains of increased pain and swelling.  Denies erythema, drainage, fever, chills, nausea, vomiting, dysphagia, drooling, dyspnea.    ROS: As per HPI.  All other pertinent ROS negative.     History reviewed. No pertinent past medical history. Past Surgical History:  Procedure Laterality Date  . ANKLE SURGERY     No Known Allergies No current facility-administered medications on file prior to encounter.   Current Outpatient Medications on File Prior to Encounter  Medication Sig Dispense Refill  . BYSTOLIC 10 MG tablet Take 10 mg by mouth daily.    . clonazePAM (KLONOPIN) 1 MG tablet Take 1 mg by mouth daily as needed.    . hydrochlorothiazide (HYDRODIURIL) 25 MG tablet Take 1 tablet (25 mg total) by mouth daily. 30 tablet 0  . ibuprofen (ADVIL) 800 MG tablet Take 800 mg by mouth every 8 (eight) hours as needed for cramping.    Marland Kitchen lisinopril (PRINIVIL,ZESTRIL) 10 MG tablet Take 40 mg by mouth daily.     . naproxen (NAPROSYN) 500 MG tablet Take 1 tablet (500 mg total) by mouth 2 (two) times daily. 30 tablet 0   Social History   Socioeconomic History  . Marital status: Married    Spouse name: Not on file  . Number of children: Not on file  . Years of education: Not on file  . Highest education level: Not on file  Occupational History  . Not on file  Tobacco Use  . Smoking status: Never Smoker  . Smokeless tobacco: Never Used  Substance and Sexual Activity  . Alcohol use: No  . Drug use: No  . Sexual activity: Not on file  Other Topics Concern  . Not on file  Social History Narrative  . Not on file   Social Determinants of Health    Financial Resource Strain:   . Difficulty of Paying Living Expenses: Not on file  Food Insecurity:   . Worried About Charity fundraiser in the Last Year: Not on file  . Ran Out of Food in the Last Year: Not on file  Transportation Needs:   . Lack of Transportation (Medical): Not on file  . Lack of Transportation (Non-Medical): Not on file  Physical Activity:   . Days of Exercise per Week: Not on file  . Minutes of Exercise per Session: Not on file  Stress:   . Feeling of Stress : Not on file  Social Connections:   . Frequency of Communication with Friends and Family: Not on file  . Frequency of Social Gatherings with Friends and Family: Not on file  . Attends Religious Services: Not on file  . Active Member of Clubs or Organizations: Not on file  . Attends Archivist Meetings: Not on file  . Marital Status: Not on file  Intimate Partner Violence:   . Fear of Current or Ex-Partner: Not on file  . Emotionally Abused: Not on file  . Physically Abused: Not on file  . Sexually Abused: Not on file   Family History  Problem Relation Age of Onset  . Diabetes Mother   . Cancer Father  OBJECTIVE:  Vitals:   10/14/19 1008  BP: (!) 181/92  Pulse: 64  Resp: 18  Temp: 98.4 F (36.9 C)  SpO2: 97%     General appearance: alert; appears uncomfortable, tearful while walking back to exam room ENT: NCAT; RT EAC clear, TM mildly injection; oropharynx clear, tolerating own secretions without difficulty, RT cheek without abscess Skin: oblong area of induration to RT side of face, appx 2-3 x 1-2 cm area of induration, increased in size from yesterday; tender to touch; no active drainage or overlying erythema Psychological: alert and cooperative; normal mood and affect  ASSESSMENT & PLAN:  1. Parotitis   2. Right facial swelling     Meds ordered this encounter  Medications  . ketorolac (TORADOL) injection 60 mg  . dexamethasone (DECADRON) injection 10 mg  .  cefTRIAXone (ROCEPHIN) injection 1 g  . predniSONE (DELTASONE) 20 MG tablet    Sig: Take 1 tablet (20 mg total) by mouth 2 (two) times daily with a meal for 5 days.    Dispense:  10 tablet    Refill:  0    Order Specific Question:   Supervising Provider    Answer:   Eustace Moore [[id number]]  . HYDROcodone-acetaminophen (NORCO/VICODIN) 5-325 MG tablet    Sig: Take 1 tablet by mouth every 12 (twelve) hours as needed for severe pain.    Dispense:  10 tablet    Refill:  0    Order Specific Question:   Supervising Provider    Answer:   Eustace Moore [[id number]]  . amoxicillin-clavulanate (AUGMENTIN) 875-125 MG tablet    Sig: Take 1 tablet by mouth every 12 (twelve) hours for 10 days.    Dispense:  20 tablet    Refill:  0    Order Specific Question:   Supervising Provider    Answer:   Eustace Moore [[id number]]   Toradol and decadron shots given in office for pain and swelling 1 gram rocephin given in office Apply warm compresses 3-4x daily for 10-15 minutes Continue to alternate ibuprofen and tylenol Norco for severe break-through pain. DO NOT TAKE prior to driving or operating heavy machinery Prednisone prescribed.  Take as directed and to completion Wash site daily with warm water and mild soap Continue with antibiotic as prescribed and to completion If symptoms do not improve or worsen over the next 24-48 hours go to the ED.  Worsening symptoms such as increased redness, swelling, pain, nausea, vomiting, fever, chills, difficulty breathing or swallowing, drooling, etc...   Called patient.  We will discontinue doxycycline and start augmentin.  Cover for parotitis  Reviewed expectations re: course of current medical issues. Questions answered. Outlined signs and symptoms indicating need for more acute intervention. Patient verbalized understanding. After Visit Summary given.          [name], PA-C 10/14/19 1029

## 2019-10-14 NOTE — ED Triage Notes (Signed)
Pt returned for second shot of rocephin , pain has increased, provider made aware

## 2019-10-19 ENCOUNTER — Telehealth: Payer: Self-pay

## 2019-10-19 MED ORDER — FLUCONAZOLE 150 MG PO TABS: 150.0000 mg | ORAL_TABLET | Freq: Once | ORAL | 0 refills | Status: AC

## 2019-10-19 NOTE — Telephone Encounter (Signed)
Pt called stating antibiotics had given her a yeast infection. Provider made aware, diflucan sent to pharmacy

## 2019-11-17 ENCOUNTER — Ambulatory Visit: Payer: Self-pay | Attending: Internal Medicine

## 2019-11-17 DIAGNOSIS — Z23 Encounter for immunization: Secondary | ICD-10-CM

## 2019-11-17 NOTE — Progress Notes (Signed)
   Covid-19 Vaccination Clinic  Name:  Samantha Bender    MRN: [id number] DOB: 10-03-1975  11/17/2019  [name] was observed post Covid-19 immunization for 15 minutes without incident. She was provided with Vaccine Information Sheet and instruction to access the V-Safe system.   [name] was instructed to call 911 with any severe reactions post vaccine: Marland Kitchen Difficulty breathing  . Swelling of face and throat  . A fast heartbeat  . A bad rash all over body  . Dizziness and weakness   Immunizations Administered    Name Date Dose VIS Date Route   Moderna COVID-19 Vaccine 11/17/2019 11:40 AM 0.5 mL 08/07/2019 Intramuscular   Manufacturer: Moderna   Lot: [id number]   NDC: 57473-403-70

## 2019-12-08 ENCOUNTER — Other Ambulatory Visit: Payer: Self-pay

## 2019-12-08 ENCOUNTER — Ambulatory Visit
Admission: EM | Admit: 2019-12-08 | Discharge: 2019-12-08 | Disposition: A | Discharge status: Home / Self Care | Payer: 59 | Attending: Emergency Medicine | Admitting: Emergency Medicine

## 2019-12-08 DIAGNOSIS — R140 Abdominal distension (gaseous): Secondary | ICD-10-CM

## 2019-12-08 DIAGNOSIS — R1031 Right lower quadrant pain: Secondary | ICD-10-CM | POA: Insufficient documentation

## 2019-12-08 DIAGNOSIS — R1032 Left lower quadrant pain: Secondary | ICD-10-CM | POA: Insufficient documentation

## 2019-12-08 LAB — POCT URINALYSIS DIP (MANUAL ENTRY)
Blood, UA: NEGATIVE
Glucose, UA: 250 mg/dL — AB
Nitrite, UA: POSITIVE — AB
Protein Ur, POC: 100 mg/dL — AB
Spec Grav, UA: 1.015 (ref 1.010–1.025)
Urobilinogen, UA: 4 E.U./dL — AB
pH, UA: 5 (ref 5.0–8.0)

## 2019-12-08 LAB — POCT URINE PREGNANCY: Preg Test, Ur: NEGATIVE

## 2019-12-08 MED ORDER — POLYETHYLENE GLYCOL 3350 17 G PO PACK: 17.0000 g | PACK | Freq: Every day | ORAL | 0 refills | Status: DC

## 2019-12-08 MED ORDER — DICYCLOMINE HCL 20 MG PO TABS: 20.0000 mg | ORAL_TABLET | Freq: Two times a day (BID) | ORAL | 0 refills | Status: DC

## 2019-12-08 NOTE — Discharge Instructions (Signed)
Offered further evaluation and management in the ED for abdominal pain.  Cannot rule out colitis, diverticulitis, ovarian torsion, ovarian cyst, appendicitis, or other abdominal etiology in Urgent care.  Patient aware and declines further work-up in the ED at this time.  Will try outpatient therapy first.  Aware of risk associated with decision including delayed diagnosis/ treatment, organ damage, organ failure, and/or death.    Urine concerning for UTI, but given lack of urinary symptoms, results most likely secondary to AZO use.  Will culture and follow up with you regarding abnormal results.   Discontinue AZO, doxycycline and imodium Recommend increasing water intake.  Drink at least half your body weight in ounces Miralax prescribed.  Take as directed and to completion Bentyl prescribed as needed for cramping Follow up with PCP for further evaluation and management Return or go to the ED if you have any new or worsening symptoms such as increased abdominal pain, nausea, vomiting, if you go 3-4 days without bowel movement, chest pain, shortness of breath, abdomen feels hard or distended, etc..Marland Kitchen

## 2019-12-08 NOTE — ED Triage Notes (Signed)
Pt presents with c/o lower abdominal pain for past 3 days, states feels like trapped gas , has been having bowel movements, states hurts with BM

## 2019-12-08 NOTE — ED Provider Notes (Signed)
[facility] CARE CENTER   [id number] 12/08/19 Arrival Time: 1018  CC: ABDOMINAL DISCOMFORT  SUBJECTIVE:  Samantha Bender is a 44 y.o. female who presents with complaint of abdominal discomfort that began 3 days ago.  Denies a precipitating event, trauma, close contacts with similar symptoms, recent travel.  Localizes discomfort to lower abdomen, and RT flank.  Describes as stable, intermittent and achy in character.  Has tried AZO, doxycycline, and imodium with minimal relief.  Reports aggravating factors with having a BM, reports feeling bloated/ gassy.  Reports similar symptoms in the past.  Last BM this morning and looser.    Denies fever, chills, nausea, vomiting, chest pain, SOB, diarrhea, constipation, hematochezia, melena, dysuria, difficulty urinating, increased frequency or urgency, flank pain, loss of bowel or bladder function, vaginal discharge, vaginal odor, vaginal bleeding, dyspareunia, pelvic pain.     Patient's last menstrual period was 11/23/2019 (approximate). Not on [facility] and is sexually active.    ROS: As per HPI.  All other pertinent ROS negative.     No past medical history on file. Past Surgical History:  Procedure Laterality Date  . ANKLE SURGERY     No Known Allergies No current facility-administered medications on file prior to encounter.   Current Outpatient Medications on File Prior to Encounter  Medication Sig Dispense Refill  . BYSTOLIC 10 MG tablet Take 10 mg by mouth daily.    . clonazePAM (KLONOPIN) 1 MG tablet Take 1 mg by mouth daily as needed.    . hydrochlorothiazide (HYDRODIURIL) 25 MG tablet Take 1 tablet (25 mg total) by mouth daily. 30 tablet 0  . HYDROcodone-acetaminophen (NORCO/VICODIN) 5-325 MG tablet Take 1 tablet by mouth every 12 (twelve) hours as needed for severe pain. 10 tablet 0  . ibuprofen (ADVIL) 800 MG tablet Take 800 mg by mouth every 8 (eight) hours as needed for cramping.    Marland Kitchen lisinopril (PRINIVIL,ZESTRIL) 10 MG tablet Take 40 mg by  mouth daily.     . naproxen (NAPROSYN) 500 MG tablet Take 1 tablet (500 mg total) by mouth 2 (two) times daily. 30 tablet 0   Social History   Socioeconomic History  . Marital status: Married    Spouse name: Not on file  . Number of children: Not on file  . Years of education: Not on file  . Highest education level: Not on file  Occupational History  . Not on file  Tobacco Use  . Smoking status: Never Smoker  . Smokeless tobacco: Never Used  Substance and Sexual Activity  . Alcohol use: No  . Drug use: No  . Sexual activity: Not on file  Other Topics Concern  . Not on file  Social History Narrative  . Not on file   Social Determinants of Health   Financial Resource Strain:   . Difficulty of Paying Living Expenses:   Food Insecurity:   . Worried About Programme researcher, broadcasting/film/video in the Last Year:   . Barista in the Last Year:   Transportation Needs:   . Freight forwarder (Medical):   Marland Kitchen Lack of Transportation (Non-Medical):   Physical Activity:   . Days of Exercise per Week:   . Minutes of Exercise per Session:   Stress:   . Feeling of Stress :   Social Connections:   . Frequency of Communication with Friends and Family:   . Frequency of Social Gatherings with Friends and Family:   . Attends Religious Services:   . Active Member of  Clubs or Organizations:   . Attends Archivist Meetings:   Marland Kitchen Marital Status:   Intimate Partner Violence:   . Fear of Current or Ex-Partner:   . Emotionally Abused:   Marland Kitchen Physically Abused:   . Sexually Abused:    Family History  Problem Relation Age of Onset  . Diabetes Mother   . Cancer Father      OBJECTIVE:  Vitals:   12/08/19 1029  BP: (!) 162/97  Pulse: 64  Resp: 18  Temp: 98.5 F (36.9 C)  SpO2: 96%    General appearance: Alert; NAD HEENT: NCAT.  Oropharynx clear.  Lungs: clear to auscultation bilaterally without adventitious breath sounds Heart: regular rate and rhythm.   Abdomen: soft,  non-distended; normal active bowel sounds; non-tender to light and deep palpation; nontender at McBurney's point;  no guarding Back: no CVA tenderness Extremities: no edema; symmetrical with no gross deformities Skin: warm and dry Neurologic: normal gait Psychological: alert and cooperative; normal mood and affect  LABS: Results for orders placed or performed during the hospital encounter of 12/08/19 (from the past 24 hour(s))  POCT urinalysis dipstick     Status: Abnormal   Collection Time: 12/08/19 10:41 AM  Result Value Ref Range   Color, UA orange (A) yellow   Clarity, UA clear clear   Glucose, UA =250 (A) negative mg/dL   Bilirubin, UA small (A) negative   Ketones, POC UA small (15) (A) negative mg/dL   Spec Grav, UA 1.015 1.010 - 1.025   Blood, UA negative negative   pH, UA 5.0 5.0 - 8.0   Protein Ur, POC =100 (A) negative mg/dL   Urobilinogen, UA 4.0 (A) 0.2 or 1.0 E.U./dL   Nitrite, UA Positive (A) Negative   Leukocytes, UA Large (3+) (A) Negative  POCT urine pregnancy     Status: None   Collection Time: 12/08/19 10:41 AM  Result Value Ref Range   Preg Test, Ur Negative Negative    ASSESSMENT & PLAN:  1. Bilateral lower abdominal discomfort   2. Bloating     Meds ordered this encounter  Medications  . dicyclomine (BENTYL) 20 MG tablet    Sig: Take 1 tablet (20 mg total) by mouth 2 (two) times daily.    Dispense:  20 tablet    Refill:  0    Order Specific Question:   Supervising Provider    Answer:   Raylene Everts [[id number]]  . polyethylene glycol (MIRALAX / GLYCOLAX) 17 g packet    Sig: Take 17 g by mouth daily.    Dispense:  14 each    Refill:  0    Order Specific Question:   Supervising Provider    Answer:   Raylene Everts [[id number]]    Offered further evaluation and management in the ED for abdominal pain.  Cannot rule out colitis, diverticulitis, ovarian torsion, ovarian cyst, appendicitis, or other abdominal etiology in Urgent care.  Patient  aware and declines further work-up in the ED at this time.  Will try outpatient therapy first.  Aware of risk associated with decision including delayed diagnosis/ treatment, organ damage, organ failure, and/or death.    Urine concerning for UTI, but given lack of urinary symptoms, results most likely secondary to AZO use.  Will culture and follow up with you regarding abnormal results.   Discontinue AZO, doxycycline and imodium Recommend increasing water intake.  Drink at least half your body weight in ounces Miralax prescribed.  Take as directed and  to completion Bentyl prescribed as needed for cramping Follow up with PCP for further evaluation and management Return or go to the ED if you have any new or worsening symptoms such as increased abdominal pain, nausea, vomiting, if you go 3-4 days without bowel movement, chest pain, shortness of breath, abdomen feels hard or distended, etc...  Reviewed expectations re: course of current medical issues. Questions answered. Outlined signs and symptoms indicating need for more acute intervention. Patient verbalized understanding. After Visit Summary given.   [name], PA-C 12/08/19 1127

## 2019-12-09 LAB — URINE CULTURE: Culture: NO GROWTH

## 2019-12-19 ENCOUNTER — Ambulatory Visit: Payer: 59 | Attending: Internal Medicine

## 2019-12-19 DIAGNOSIS — Z23 Encounter for immunization: Secondary | ICD-10-CM

## 2019-12-19 NOTE — Progress Notes (Signed)
   Covid-19 Vaccination Clinic  Name:  Lavone Barrientes    MRN: [id number] DOB: [birth year]  12/19/2019  Ms. Elicker was observed post Covid-19 immunization for 15 minutes without incident. She was provided with Vaccine Information Sheet and instruction to access the V-Safe system.   Ms. Sturkey was instructed to call 911 with any severe reactions post vaccine: Marland Kitchen Difficulty breathing  . Swelling of face and throat  . A fast heartbeat  . A bad rash all over body  . Dizziness and weakness   Immunizations Administered    Name Date Dose VIS Date Route   Moderna COVID-19 Vaccine 12/19/2019 11:37 AM 0.5 mL 08/07/2019 Intramuscular   Manufacturer: Moderna   Lot: [id number]   NDC: 27782-423-53

## 2020-02-12 ENCOUNTER — Ambulatory Visit (INDEPENDENT_AMBULATORY_CARE_PROVIDER_SITE_OTHER): Payer: 59 | Admitting: Internal Medicine

## 2020-03-13 ENCOUNTER — Ambulatory Visit (INDEPENDENT_AMBULATORY_CARE_PROVIDER_SITE_OTHER): Payer: 59 | Admitting: Nurse Practitioner

## 2020-04-06 IMAGING — DX DG FOOT COMPLETE 3+V*L*
3 series · 3 of 3 positions shown · non-contrast
Comparison: 05/06/2010

CLINICAL DATA: 43-year-old female with a history of foot injury

EXAM:
LEFT FOOT - COMPLETE 3+ VIEW

[foot ap]
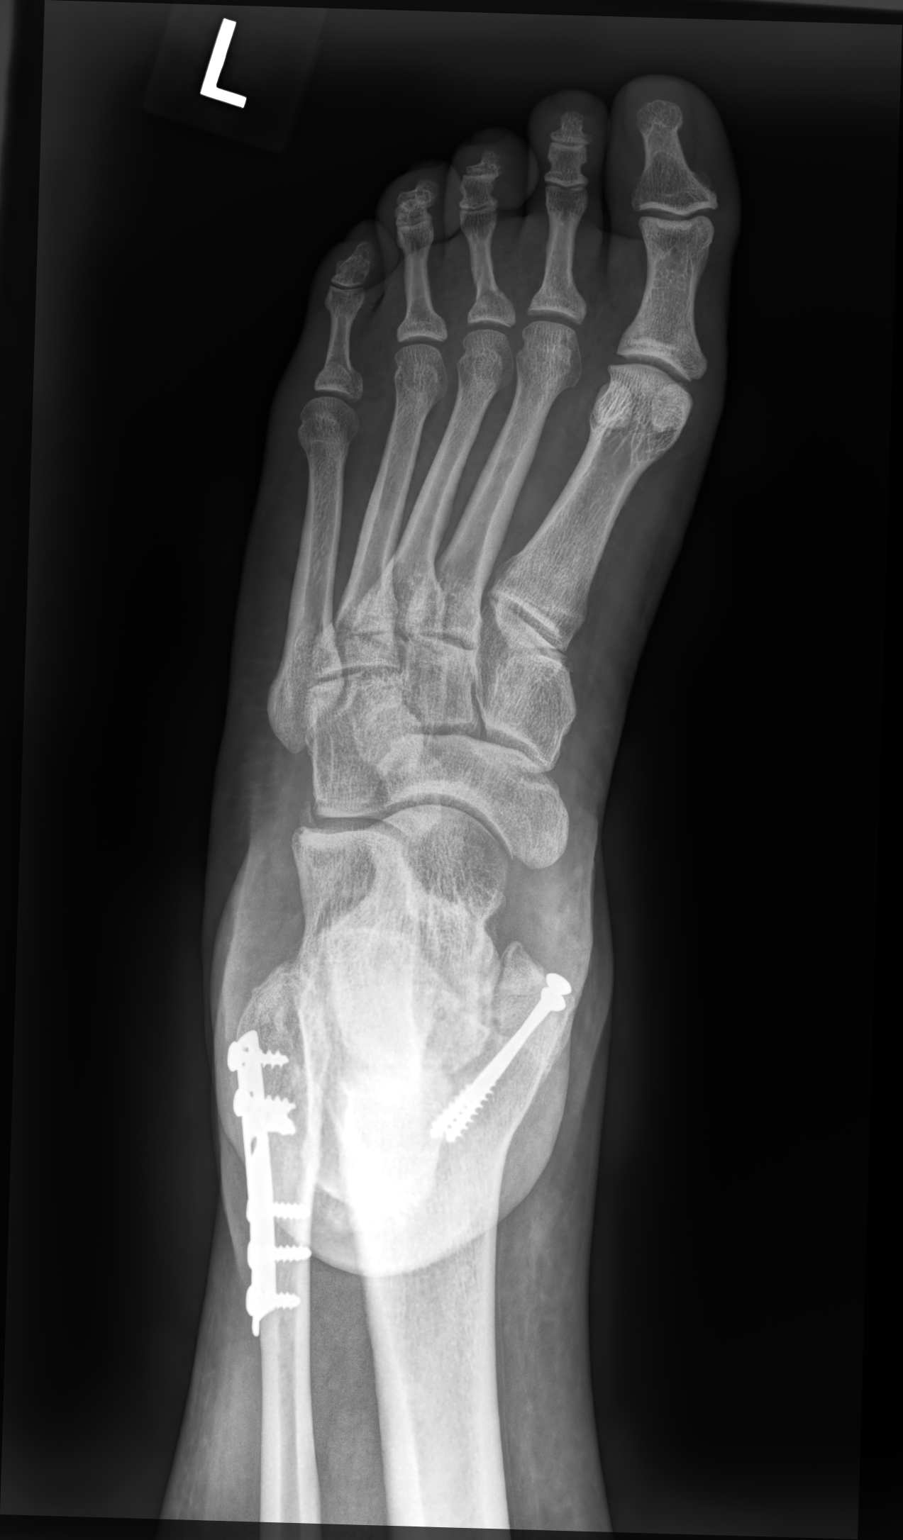

[foot mlo]
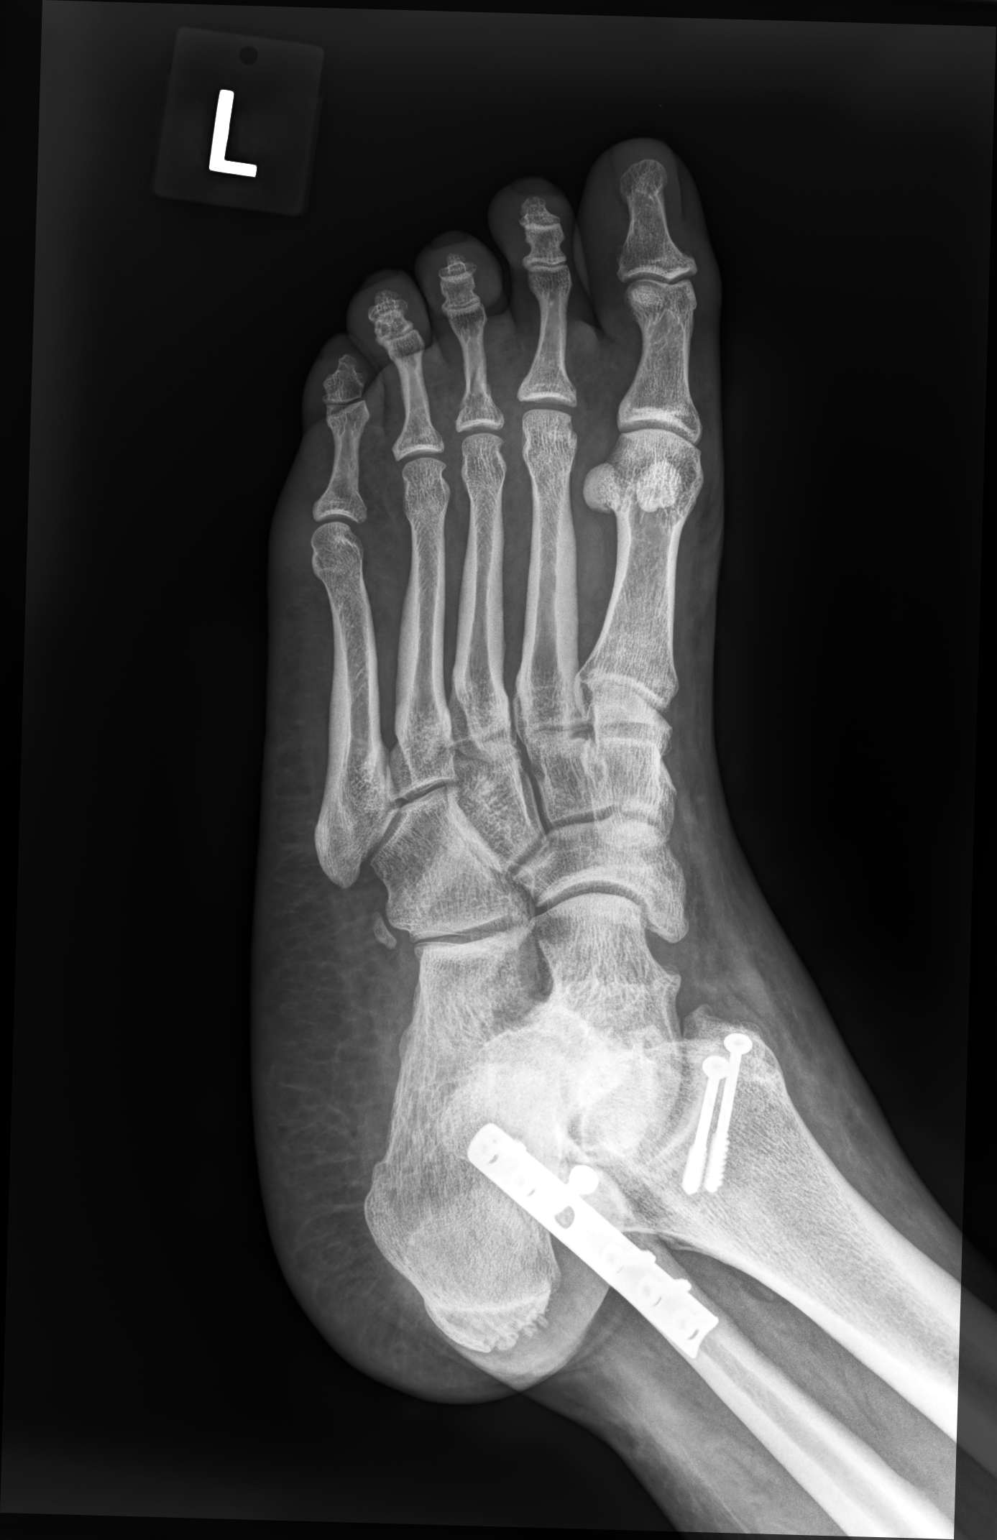

[foot lat]
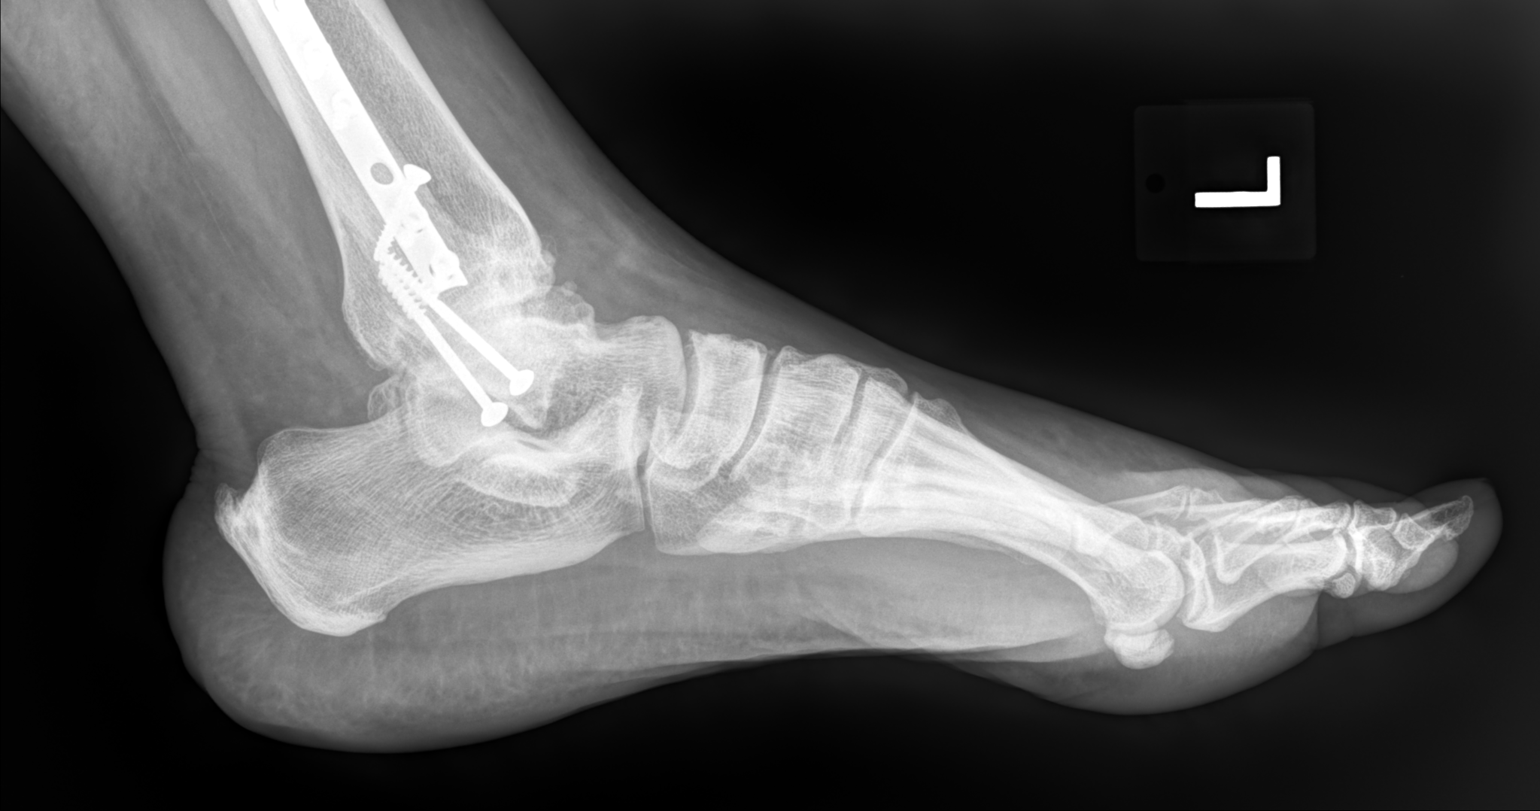

[3 of 3 positions shown; findings below may reference images not displayed]

FINDINGS: Acute nondisplaced fracture of the proximal phalanx of the third
digit.

Remote surgical changes of prior open reduction internal fixation of
distal tibia and fibula. No hardware fracture.

No unexpected radiopaque foreign body. No unexpected soft tissue
density. Evidence of posttraumatic degenerative changes at the
tibiotalar joint, not well evaluated on this foot plain film series.
IMPRESSION: Acute nondisplaced fracture of the proximal phalanx of the third
digit left foot.

Surgical changes of prior open reduction internal fixation of the
distal tibia and fibula, with likely posttraumatic degenerative
changes of the tibiotalar joint.

## 2020-04-22 ENCOUNTER — Encounter (INDEPENDENT_AMBULATORY_CARE_PROVIDER_SITE_OTHER): Payer: Self-pay | Admitting: Nurse Practitioner

## 2020-04-22 ENCOUNTER — Ambulatory Visit (INDEPENDENT_AMBULATORY_CARE_PROVIDER_SITE_OTHER): Payer: 59 | Admitting: Nurse Practitioner

## 2020-04-22 ENCOUNTER — Other Ambulatory Visit: Payer: Self-pay

## 2020-04-22 ENCOUNTER — Telehealth (INDEPENDENT_AMBULATORY_CARE_PROVIDER_SITE_OTHER): Payer: Self-pay | Admitting: Nurse Practitioner

## 2020-04-22 VITALS — BP 160/120 | HR 80 | Temp 97.3°F | Ht 66.0 in | Wt 270.0 lb

## 2020-04-22 DIAGNOSIS — R5383 Other fatigue: Secondary | ICD-10-CM

## 2020-04-22 DIAGNOSIS — I10 Essential (primary) hypertension: Secondary | ICD-10-CM | POA: Diagnosis not present

## 2020-04-22 DIAGNOSIS — R1310 Dysphagia, unspecified: Secondary | ICD-10-CM | POA: Diagnosis not present

## 2020-04-22 DIAGNOSIS — R21 Rash and other nonspecific skin eruption: Secondary | ICD-10-CM | POA: Insufficient documentation

## 2020-04-22 DIAGNOSIS — Z6841 Body Mass Index (BMI) 40.0 and over, adult: Secondary | ICD-10-CM

## 2020-04-22 DIAGNOSIS — E669 Obesity, unspecified: Secondary | ICD-10-CM | POA: Insufficient documentation

## 2020-04-22 MED ORDER — BYSTOLIC 10 MG PO TABS: 10.0000 mg | ORAL_TABLET | Freq: Every day | ORAL | 1 refills | Status: DC

## 2020-04-22 NOTE — Progress Notes (Signed)
Subjective:  Patient ID: Samantha Bender, female    DOB: [birth year]  Age: 44 y.o. MRN: [id number]  CC:  Chief Complaint  Patient presents with   cold all of the time   Rash    right foot and on each thigh   throat popping on right side   Obesity   Medication Refill    Bystolic      HPI  This patient arrives today to establish care at this practice.  She is a 44 year old female that is coming to this practice because her previous provider is located in [place], however she has heard good things about our practice and was looking to change to a new doctor.  She has a past medical history significant for depression, obesity, and hypertension.  She tells me today that she is completely out of her Bystolic that she takes for hypertension and needs a refill on this today.  She has multiple complaints today but tells me her top two concerns are a pop she feels in her throat when swallowing and a rash.  As for the swallowing concern she tells me this has been going on for approximately 1 month.  She tells me when she swallows she feels like a click or a pop occurs on the right side of her throat.  When she places her hand on the spot the pop will subside.  She tells me that generally when she is eating she does not have any dysphagia except for when she is sometimes eating certain meats such as chicken.  She will sometimes have a hard time swallowing this will help to slow down with her eating.  She tells me she is never been evaluated for this in the past.  She denies any abdominal pain, nausea, vomiting, unexplained fevers, unexplained weight loss, shortness of breath, chest pain.  She tells me she is also developed a rash to her bilateral upper thighs both anteriorly and posteriorly.  She tells me she can feel bumps in these areas.  She tells me approximately 1 month ago she felt what she thought was a boil on her upper thigh but since then it has spread and she has been more bumps.   She denies pain or itching.  She says sometimes when she is sitting she will experience some discomfort to her upper thighs.  She has been trying triamcinolone cream that she has been applying to the areas off and on for the last month.    Past Medical History:  Diagnosis Date   Depression    Hypertension    Obesity       Family History  Problem Relation Age of Onset   Diabetes Mother    Cancer Father     Social History   Social History Narrative   Not on file   Social History   Tobacco Use   Smoking status: Never Smoker   Smokeless tobacco: Never Used  Substance Use Topics   Alcohol use: No     Current Meds  Medication Sig   BYSTOLIC 10 MG tablet Take 1 tablet (10 mg total) by mouth daily.   clonazePAM (KLONOPIN) 1 MG tablet Take 1 mg by mouth daily as needed.   hydrochlorothiazide (HYDRODIURIL) 25 MG tablet Take 1 tablet (25 mg total) by mouth daily.   ibuprofen (ADVIL) 800 MG tablet Take 800 mg by mouth every 8 (eight) hours as needed for cramping.   lisinopril (PRINIVIL,ZESTRIL) 10 MG tablet Take 40 mg by  mouth daily.    naproxen (NAPROSYN) 500 MG tablet Take 1 tablet (500 mg total) by mouth 2 (two) times daily.   polyethylene glycol (MIRALAX / GLYCOLAX) 17 g packet Take 17 g by mouth daily.   [DISCONTINUED] BYSTOLIC 10 MG tablet Take 10 mg by mouth daily.    ROS:  Review of Systems  Constitutional: Positive for malaise/fatigue. Negative for fever and weight loss.  Respiratory: Negative for shortness of breath.   Cardiovascular: Negative for chest pain.  Gastrointestinal: Negative for abdominal pain, nausea and vomiting.       (-) Hoarseness  Skin: Positive for rash.     Objective:   Today's Vitals: BP (!) 160/120 (BP Location: Left Arm, Patient Position: Sitting, Cuff Size: Normal)    Pulse 80    Temp (!) 97.3 F (36.3 C) (Temporal)    Ht '5\' 6"'  (1.676 m)    Wt 270 lb (122.5 kg)    SpO2 95%    BMI 43.58 kg/m  Vitals with BMI  04/22/2020 12/08/2019 10/14/2019  Height '5\' 6"'  - -  Weight 270 lbs - -  BMI 76.7 - -  Systolic [phone]  Diastolic 409 97 92  Pulse 80 64 64     Physical Exam Vitals reviewed.  Constitutional:      General: She is not in acute distress.    Appearance: Normal appearance.  HENT:     Head: Normocephalic and atraumatic.  Neck:     Thyroid: No thyroid mass, thyromegaly or thyroid tenderness.     Vascular: No carotid bruit.  Cardiovascular:     Rate and Rhythm: Normal rate and regular rhythm.     Pulses: Normal pulses.     Heart sounds: Normal heart sounds.  Pulmonary:     Effort: Pulmonary effort is normal.     Breath sounds: Normal breath sounds.  Lymphadenopathy:     Cervical: No cervical adenopathy.     Upper Body:     Right upper body: No supraclavicular adenopathy.     Left upper body: No supraclavicular adenopathy.  Skin:    General: Skin is warm and dry.       Neurological:     General: No focal deficit present.     Mental Status: She is alert and oriented to person, place, and time.  Psychiatric:        Mood and Affect: Mood normal.        Behavior: Behavior normal.        Judgment: Judgment normal.          Assessment and Plan   1. Hypertension, unspecified type   2. Dysphagia, unspecified type   3. Rash   4. Fatigue, unspecified type   5. Class 3 severe obesity with serious comorbidity and body mass index (BMI) of 40.0 to 44.9 in adult, unspecified obesity type ([place])      Plan: 1.  I will refill her Bystolic today.  She will return in approximately 1 week to have blood pressure checked and follow-up office visit. 2.  As far as the popping she feels in her throat when she swallows I am not sure about the clinical significance or etiology of this.  On exam I did not note any masses, lymphadenopathy, or thyromegaly.  No tenderness was noted either.  She does describe some intermittent dysphagia so at this point I am going to recommend that we refer her to  gastroenterology for further evaluation of her dysphagia and sensation of  popping when she swallows. 3.  I am also not sure of etiology of her rash.  We will check some autoimmune blood tests in addition to her other blood work today.  I did discuss that sometimes sexual transmitted infections can present as rashes and offered to screen for these, but she has declined to have this done today.  She would like to see dermatology for further evaluation and I will send referral for that today as well. 4, 5.  We will collect blood work for further evaluation today.   Tests ordered Orders Placed This Encounter  Procedures   CBC with Differential/Platelets   CMP with eGFR([facility])   ANA   Rheumatoid Factor   Hemoglobin A1c   Lipid Panel   TSH   T3, Free   T4, Free   Ambulatory referral to Gastroenterology   Ambulatory referral to Dermatology      Meds ordered this encounter  Medications   BYSTOLIC 10 MG tablet    Sig: Take 1 tablet (10 mg total) by mouth daily.    Dispense:  90 tablet    Refill:  1    Order Specific Question:   Supervising Provider    Answer:   Doree Albee [[id number]]    Patient to follow-up in 1 week or sooner as needed.  Ailene Ards, NP

## 2020-04-22 NOTE — Telephone Encounter (Signed)
Please call this patient and see if she would be willing to come in for a nurses visit next week.  It can be as early as Monday to have blood pressure checked.  I do not want her going any longer than 2 weeks from today without having her blood pressure checked.  Please let me know if she is unwilling or unable to come in for a nurses visit within the next 2 weeks.  The alternative is that if she has a blood pressure cuff at home I can do a telemetry visit.   I did discuss the situation with Dr. Karilyn Cota and we both have agreed that she needs to make her health a priority.  Because her blood pressure was elevated today she needs to be willing to come in/complete a telemetry visit as long as she has a working automatic blood pressure cuff within the next 2 weeks so we can get her blood pressure under control, if she is not willing to do this then we cannot be her provider.   I am more than happy to provide her with a work note if this is needed for excused absence from work.  Once we get her chronic conditions stabilized she may not have to come as frequently.  Please explain this to her.

## 2020-04-24 ENCOUNTER — Encounter: Payer: Self-pay | Admitting: Internal Medicine

## 2020-04-24 LAB — COMPLETE METABOLIC PANEL WITH GFR
AG Ratio: 0.9 (calc) — ABNORMAL LOW (ref 1.0–2.5)
ALT: 13 U/L (ref 6–29)
AST: 16 U/L (ref 10–30)
Albumin: 3.6 g/dL (ref 3.6–5.1)
Alkaline phosphatase (APISO): 42 U/L (ref 31–125)
BUN: 11 mg/dL (ref 7–25)
CO2: 30 mmol/L (ref 20–32)
Calcium: 8.9 mg/dL (ref 8.6–10.2)
Chloride: 105 mmol/L (ref 98–110)
Creat: 0.79 mg/dL (ref 0.50–1.10)
GFR, Est African American: 108 mL/min/{1.73_m2} (ref >=60)
GFR, Est Non African American: 93 mL/min/{1.73_m2} (ref >=60)
Globulin: 3.9 g/dL (calc) — ABNORMAL HIGH (ref 1.9–3.7)
Glucose, Bld: 85 mg/dL (ref 65–99)
Potassium: 4.1 mmol/L (ref 3.5–5.3)
Sodium: 140 mmol/L (ref 135–146)
Total Bilirubin: 0.4 mg/dL (ref 0.2–1.2)
Total Protein: 7.5 g/dL (ref 6.1–8.1)

## 2020-04-24 LAB — LIPID PANEL
Cholesterol: 169 mg/dL (ref <200)
HDL: 45 mg/dL — ABNORMAL LOW (ref >=50)
LDL Cholesterol (Calc): 104 mg/dL (calc) — ABNORMAL HIGH
Non-HDL Cholesterol (Calc): 124 mg/dL (calc) (ref <130)
Total CHOL/HDL Ratio: 3.8 (calc) (ref <5.0)
Triglycerides: 100 mg/dL (ref <150)

## 2020-04-24 LAB — CBC WITH DIFFERENTIAL/PLATELET
Absolute Monocytes: 513 cells/uL (ref 200–950)
Basophils Absolute: 11 cells/uL (ref 0–200)
Basophils Relative: 0.3 %
Eosinophils Absolute: 110 cells/uL (ref 15–500)
Eosinophils Relative: 2.9 %
HCT: 38.1 % (ref 35.0–45.0)
Hemoglobin: 12.7 g/dL (ref 11.7–15.5)
Lymphs Abs: 958 cells/uL (ref 850–3900)
MCH: 32.6 pg (ref 27.0–33.0)
MCHC: 33.3 g/dL (ref 32.0–36.0)
MCV: 97.7 fL (ref 80.0–100.0)
MPV: 11.4 fL (ref 7.5–12.5)
Monocytes Relative: 13.5 %
Neutro Abs: 2208 cells/uL (ref 1500–7800)
Neutrophils Relative %: 58.1 %
Platelets: 219 10*3/uL (ref 140–400)
RBC: 3.9 10*6/uL (ref 3.80–5.10)
RDW: 12.4 % (ref 11.0–15.0)
Total Lymphocyte: 25.2 %
WBC: 3.8 10*3/uL (ref 3.8–10.8)

## 2020-04-24 LAB — TSH: TSH: 2.84 mIU/L

## 2020-04-24 LAB — ANTI-NUCLEAR AB-TITER (ANA TITER)
ANA TITER: 1:640 {titer} — ABNORMAL HIGH
ANA Titer 1: 1:1280 {titer} — ABNORMAL HIGH

## 2020-04-24 LAB — RHEUMATOID FACTOR: Rheumatoid fact SerPl-aCnc: <14 IU/mL (ref <14)

## 2020-04-24 LAB — T4, FREE: Free T4: 1.1 ng/dL (ref 0.8–1.8)

## 2020-04-24 LAB — HEMOGLOBIN A1C
Hgb A1c MFr Bld: 5.3 % of total Hgb (ref <5.7)
Mean Plasma Glucose: 105 (calc)
eAG (mmol/L): 5.8 (calc)

## 2020-04-24 LAB — ANA: Anti Nuclear Antibody (ANA): POSITIVE — AB

## 2020-04-24 LAB — T3, FREE: T3, Free: 2.8 pg/mL (ref 2.3–4.2)

## 2020-04-24 NOTE — Telephone Encounter (Signed)
She is coming in next [facility] 05/01/20 for nurse visit to get BP checked

## 2020-05-01 ENCOUNTER — Other Ambulatory Visit: Payer: Self-pay

## 2020-05-01 ENCOUNTER — Encounter (INDEPENDENT_AMBULATORY_CARE_PROVIDER_SITE_OTHER): Payer: Self-pay

## 2020-05-01 ENCOUNTER — Ambulatory Visit (INDEPENDENT_AMBULATORY_CARE_PROVIDER_SITE_OTHER): Payer: 59 | Admitting: Internal Medicine

## 2020-05-01 VITALS — BP 166/88 | HR 62 | Temp 97.9°F | Resp 18 | Ht 66.0 in | Wt 265.0 lb

## 2020-05-01 DIAGNOSIS — R5383 Other fatigue: Secondary | ICD-10-CM

## 2020-05-01 DIAGNOSIS — I10 Essential (primary) hypertension: Secondary | ICD-10-CM

## 2020-05-01 DIAGNOSIS — R5381 Other malaise: Secondary | ICD-10-CM | POA: Diagnosis not present

## 2020-05-01 MED ORDER — LOSARTAN POTASSIUM-HCTZ 50-12.5 MG PO TABS: 1.0000 | ORAL_TABLET | Freq: Every day | ORAL | 3 refills | Status: DC

## 2020-05-01 MED ORDER — NP THYROID 30 MG PO TABS: 30.0000 mg | ORAL_TABLET | Freq: Every day | ORAL | 3 refills | Status: DC

## 2020-05-01 NOTE — Progress Notes (Signed)
Metrics: Intervention Frequency ACO  Documented Smoking Status Yearly  Screened one or more times in 24 months  Cessation Counseling or  Active cessation medication Past 24 months  Past 24 months   Guideline developer: UpToDate (See UpToDate for funding source) Date Released: 2014       Wellness Office Visit  Subjective:  Patient ID: Corrin Sieling, female    DOB: 11-Mar-1976  Age: 44 y.o. MRN: [id number]  CC: This lady came in for nurse visit but turned into an office visit because of her elevated blood pressure. HPI  She has been taking Bystolic only in the last week or so.  She is worried about repercussions of uncontrolled hypertension. She also feels tired, dry skin, hair loss, constipation and her T3 levels were in a suboptimal range. She also has ANA abnormalities indicative of possible connective tissue disease. Past Medical History:  Diagnosis Date  . Depression   . Hypertension   . Obesity    Past Surgical History:  Procedure Laterality Date  . ANKLE SURGERY       Family History  Problem Relation Age of Onset  . Diabetes Mother   . Cancer Father     Social History   Social History Narrative   Married for 15 years.Lives with husband.Works for [employer] ,Research officer, political party.   Social History   Tobacco Use  . Smoking status: Never Smoker  . Smokeless tobacco: Never Used  Substance Use Topics  . Alcohol use: No    Current Meds  Medication Sig  . BYSTOLIC 10 MG tablet Take 1 tablet (10 mg total) by mouth daily.  Marland Kitchen ibuprofen (ADVIL) 800 MG tablet Take 800 mg by mouth every 8 (eight) hours as needed for cramping.  Marland Kitchen lisinopril (PRINIVIL,ZESTRIL) 10 MG tablet Take 40 mg by mouth daily.   . naproxen (NAPROSYN) 500 MG tablet Take 1 tablet (500 mg total) by mouth 2 (two) times daily.  . polyethylene glycol (MIRALAX / GLYCOLAX) 17 g packet Take 17 g by mouth daily.  Marland Kitchen triamcinolone (KENALOG) 0.025 % cream Apply 1 application topically 2 (two) times  daily.       No flowsheet data found.   Objective:   Today's Vitals: BP (!) 166/88   Pulse 62   Temp 97.9 F (36.6 C) (Temporal)   Resp 18   Ht 5\' 6"  (1.676 m)   Wt 265 lb (120.2 kg)   SpO2 98%   BMI 42.77 kg/m  Vitals with BMI 05/01/2020 04/22/2020 12/08/2019  Height 5\' 6"  5\' 6"  -  Weight 265 lbs 270 lbs -  BMI 42.79 43.6 -  Systolic 166 160 02/07/2020  Diastolic 88 120 97  Pulse 62 80 64     Physical Exam  She looks systemically well and is alert and orientated but has uncontrolled hypertension.  She is morbidly obese.     Assessment   1. Hypertension, unspecified type   2. Morbid obesity (HCC)   3. Malaise and fatigue       Tests ordered No orders of the defined types were placed in this encounter.    Plan: 1. I have told her to stop taking Bystolic and I have put her on a combination of losartan/hydrochlorthiazide.  I think this will work better for her. 2. I think she does have symptoms of thyroid deficiency and I am going to start her on NP thyroid, which is being used off label as for the symptoms.  I explained possible side effects and how  to deal with them. 3. We also discussed nutrition briefly and I told her to do intermittent fasting combined with a plant-based diet incorporating beans on a daily basis if possible.  And I also recommended she reduce animal protein intake significantly.  She needs to drink 1 gallon of water every day also. 4. Follow-up in 6 weeks.   Meds ordered this encounter  Medications  . losartan-hydrochlorothiazide (HYZAAR) 50-12.5 MG tablet    Sig: Take 1 tablet by mouth daily.    Dispense:  30 tablet    Refill:  3  . NP THYROID 30 MG tablet    Sig: Take 1 tablet (30 mg total) by mouth daily before breakfast.    Dispense:  30 tablet    Refill:  3    Latoyna Hird Normajean Glasgow, MD

## 2020-05-01 NOTE — Progress Notes (Signed)
Pt is working is having a great deal 6 mths now . Have a to share a great deal with you on health and family. Dad passed away with a great deal of conditions. She is tearful, and scared for her health. Wanting to do better and get better over all. She looking at th NP Thyroid poster on room wall. She is thinking she has a great deal of symptoms.

## 2020-05-01 NOTE — Patient Instructions (Signed)
[facility] Dietary Recommendations for Weight Loss What to Avoid . Avoid added sugars o Often added sugar can be found in processed foods such as many condiments, dry cereals, cakes, cookies, chips, crisps, crackers, candies, sweetened drinks, etc.  o Read labels and AVOID/DECREASE use of foods with the following in their ingredient list: Sugar, fructose, high fructose corn syrup, sucrose, glucose, maltose, dextrose, molasses, cane sugar, brown sugar, any type of syrup, agave nectar, etc.   . Avoid snacking in between meals . Avoid foods made with flour o If you are going to eat food made with flour, choose those made with whole-grains; and, minimize your consumption as much as is tolerable . Avoid processed foods o These foods are generally stocked in the middle of the grocery store. Focus on shopping on the perimeter of the grocery.  . Avoid Meat  o We recommend following a plant-based diet at [facility]. Thus, we recommend avoiding meat as a general rule. Consider eating beans, legumes, eggs, and/or dairy products for regular protein sources o If you plan on eating meat limit to 4 ounces of meat at a time and choose lean options such as Fish, chicken, turkey. Avoid red meat intake such as pork and/or steak What to Include . Vegetables o GREEN LEAFY VEGETABLES: Kale, spinach, mustard greens, collard greens, cabbage, broccoli, etc. o OTHER: Asparagus, cauliflower, eggplant, carrots, peas, Brussel sprouts, tomatoes, bell peppers, zucchini, beets, cucumbers, etc. . Grains, seeds, and legumes o Beans: kidney beans, black eyed peas, garbanzo beans, black beans, pinto beans, etc. o Whole, unrefined grains: brown rice, barley, bulgur, oatmeal, etc. . Healthy fats  o Avoid highly processed fats such as vegetable oil o Examples of healthy fats: avocado, olives, virgin olive oil, dark chocolate (?72% Cocoa), nuts (peanuts, almonds, walnuts, cashews, pecans, etc.) . None to Low  Intake of Animal Sources of Protein o Meat sources: chicken, turkey, salmon, tuna. Limit to 4 ounces of meat at one time. o Consider limiting dairy sources, but when choosing dairy focus on: PLAIN Greek yogurt, cottage cheese, high-protein milk . Fruit o Choose berries  When to Eat . Intermittent Fasting: o Choosing not to eat for a specific time period, but DO FOCUS ON HYDRATION when fasting o Multiple Techniques: - Time Restricted Eating: eat 3 meals in a day, each meal lasting no more than 60 minutes, no snacks between meals - 16-18 hour fast: fast for 16 to 18 hours up to 7 days a week. Often suggested to start with 2-3 nonconsecutive days per week.  . Remember the time you sleep is counted as fasting.  . Examples of eating schedule: Fast from 7:00pm-11:00am. Eat between 11:00am-7:00pm.  - 24-hour fast: fast for 24 hours up to every other day. Often suggested to start with 1 day per week . Remember the time you sleep is counted as fasting . Examples of eating schedule:  o Eating day: eat 2-3 meals on your eating day. If doing 2 meals, each meal should last no more than 90 minutes. If doing 3 meals, each meal should last no more than 60 minutes. Finish last meal by 7:00pm. o Fasting day: Fast until 7:00pm.  o IF YOU FEEL UNWELL FOR ANY REASON/IN ANY WAY WHEN FASTING, STOP FASTING BY EATING A NUTRITIOUS SNACK OR LIGHT MEAL o ALWAYS FOCUS ON HYDRATION DURING FASTS - Acceptable Hydration sources: water, broths, tea/coffee (black tea/coffee is best but using a small amount of whole-fat dairy products in coffee/tea is acceptable).  -   Poor Hydration Sources: anything with sugar or artificial sweeteners added to it  These recommendations have been developed for patients that are actively receiving medical care from either Dr. Angelika Jerrett or Sarah Gray, DNP, NP-C at [facility]. These recommendations are developed for patients with specific medical conditions and are not meant to be  distributed or used by others that are not actively receiving care from either provider listed above at [facility]. It is not appropriate to participate in the above eating plans without proper medical supervision.   Reference: Fung, J. The obesity code. [place]: [place]; 2016.   

## 2020-05-28 ENCOUNTER — Telehealth (INDEPENDENT_AMBULATORY_CARE_PROVIDER_SITE_OTHER): Payer: Self-pay

## 2020-06-10 ENCOUNTER — Telehealth (INDEPENDENT_AMBULATORY_CARE_PROVIDER_SITE_OTHER): Payer: Self-pay

## 2020-06-10 NOTE — Telephone Encounter (Signed)
Pt taken medicine this morning. She will stop until ov in 2 days.

## 2020-06-10 NOTE — Telephone Encounter (Signed)
Pt called stating she felt some numbness and swelling in her hands fingers. She was put on thyroid medications. But not sure if this is why.

## 2020-06-10 NOTE — Telephone Encounter (Signed)
If she is concerned, she can stop taking the thyroid for the time being and I see that she is due to have an appointment to see me in 2 days time.  We can address it then.

## 2020-06-12 ENCOUNTER — Ambulatory Visit: Payer: 59 | Admitting: Gastroenterology

## 2020-06-12 ENCOUNTER — Encounter (INDEPENDENT_AMBULATORY_CARE_PROVIDER_SITE_OTHER): Payer: Self-pay | Admitting: Internal Medicine

## 2020-06-12 ENCOUNTER — Ambulatory Visit (INDEPENDENT_AMBULATORY_CARE_PROVIDER_SITE_OTHER): Payer: 59 | Admitting: Internal Medicine

## 2020-06-12 ENCOUNTER — Other Ambulatory Visit: Payer: Self-pay

## 2020-06-12 VITALS — BP 160/120 | HR 81 | Ht 66.0 in | Wt 263.4 lb

## 2020-06-12 DIAGNOSIS — M2550 Pain in unspecified joint: Secondary | ICD-10-CM

## 2020-06-12 DIAGNOSIS — I10 Essential (primary) hypertension: Secondary | ICD-10-CM

## 2020-06-12 MED ORDER — AMLODIPINE BESYLATE 5 MG PO TABS: 5.0000 mg | ORAL_TABLET | Freq: Every day | ORAL | 3 refills | Status: DC

## 2020-06-12 NOTE — Progress Notes (Signed)
Metrics: Intervention Frequency ACO  Documented Smoking Status Yearly  Screened one or more times in 24 months  Cessation Counseling or  Active cessation medication Past 24 months  Past 24 months   Guideline developer: UpToDate (See UpToDate for funding source) Date Released: 2014       Wellness Office Visit  Subjective:  Patient ID: Samantha Bender, female    DOB: 02-Feb-1976  Age: 44 y.o. MRN: [id number]  CC: Uncontrolled hypertension HPI  This lady comes in because she was concerned about her blood pressure that was not improving and was staying elevated.  I had switched her blood pressure medication to Hyzaar.  We also did previously discussed on the importance of losing weight. Blood work also shows a speckled pattern of high titer of ANA and she does have joint pains in her shoulders and hands. Past Medical History:  Diagnosis Date  . Depression   . Hypertension   . Obesity    Past Surgical History:  Procedure Laterality Date  . ANKLE SURGERY       Family History  Problem Relation Age of Onset  . Diabetes Mother   . Cancer Father     Social History   Social History Narrative   Married for 15 years.Lives with husband.Works for [employer] ,Research officer, political party.   Social History   Tobacco Use  . Smoking status: Never Smoker  . Smokeless tobacco: Never Used  Substance Use Topics  . Alcohol use: No    Current Meds  Medication Sig  . losartan-hydrochlorothiazide (HYZAAR) 50-12.5 MG tablet Take 1 tablet by mouth daily.  Marland Kitchen triamcinolone (KENALOG) 0.025 % cream Apply 1 application topically 2 (two) times daily.  . [DISCONTINUED] NP THYROID 30 MG tablet Take 1 tablet (30 mg total) by mouth daily before breakfast.      No flowsheet data found.   Objective:   Today's Vitals: BP (!) 160/120   Pulse 81   Ht 5\' 6"  (1.676 m)   Wt 263 lb 6.4 oz (119.5 kg)   SpO2 94%   BMI 42.51 kg/m  Vitals with BMI 06/12/2020 05/01/2020 04/22/2020  Height 5\' 6"  5\' 6"  5'  6"  Weight 263 lbs 6 oz 265 lbs 270 lbs  BMI 42.53 42.79 43.6  Systolic 160 166 04/24/2020  Diastolic 120 88 120  Pulse 81 62 80     Physical Exam  Blood pressure is uncontrolled at the present time.  She is alert and orientated without any focal neurological signs.     Assessment   1. Pain in joint, multiple sites   2. Hypertension, unspecified type       Tests ordered Orders Placed This Encounter  Procedures  . Ambulatory referral to Rheumatology     Plan: 1. I am going to add amlodipine 5 mg daily to her Hyzaar and hopefully this will improve her blood pressure. 2. I will also refer to rheumatology regarding her joint pains in multiple sites. 3. Follow-up as scheduled in about 3 weeks that she already has an appointment scheduled with me.   Meds ordered this encounter  Medications  . amLODipine (NORVASC) 5 MG tablet    Sig: Take 1 tablet (5 mg total) by mouth daily.    Dispense:  30 tablet    Refill:  3    Kanylah Muench , MD

## 2020-06-23 ENCOUNTER — Other Ambulatory Visit (INDEPENDENT_AMBULATORY_CARE_PROVIDER_SITE_OTHER): Payer: Self-pay | Admitting: Internal Medicine

## 2020-06-23 ENCOUNTER — Telehealth (INDEPENDENT_AMBULATORY_CARE_PROVIDER_SITE_OTHER): Payer: Self-pay

## 2020-06-27 ENCOUNTER — Encounter (INDEPENDENT_AMBULATORY_CARE_PROVIDER_SITE_OTHER): Payer: Self-pay | Admitting: Internal Medicine

## 2020-06-30 ENCOUNTER — Other Ambulatory Visit (INDEPENDENT_AMBULATORY_CARE_PROVIDER_SITE_OTHER): Payer: Self-pay | Admitting: Internal Medicine

## 2020-06-30 ENCOUNTER — Encounter (INDEPENDENT_AMBULATORY_CARE_PROVIDER_SITE_OTHER): Payer: Self-pay | Admitting: Internal Medicine

## 2020-06-30 MED ORDER — PREDNISONE 20 MG PO TABS: 40.0000 mg | ORAL_TABLET | Freq: Every day | ORAL | 1 refills | Status: DC

## 2020-06-30 MED ORDER — LOSARTAN POTASSIUM-HCTZ 100-25 MG PO TABS: 1.0000 | ORAL_TABLET | Freq: Every day | ORAL | 1 refills | Status: DC

## 2020-07-03 ENCOUNTER — Ambulatory Visit (INDEPENDENT_AMBULATORY_CARE_PROVIDER_SITE_OTHER): Payer: 59 | Admitting: Internal Medicine

## 2020-07-07 ENCOUNTER — Encounter (INDEPENDENT_AMBULATORY_CARE_PROVIDER_SITE_OTHER): Payer: Self-pay | Admitting: Internal Medicine

## 2020-07-10 ENCOUNTER — Ambulatory Visit: Payer: 59 | Admitting: Internal Medicine

## 2020-07-11 ENCOUNTER — Other Ambulatory Visit (INDEPENDENT_AMBULATORY_CARE_PROVIDER_SITE_OTHER): Payer: Self-pay | Admitting: Internal Medicine

## 2020-07-11 DIAGNOSIS — M2550 Pain in unspecified joint: Secondary | ICD-10-CM

## 2020-07-14 ENCOUNTER — Encounter (INDEPENDENT_AMBULATORY_CARE_PROVIDER_SITE_OTHER): Payer: Self-pay

## 2020-07-18 ENCOUNTER — Other Ambulatory Visit: Payer: Self-pay

## 2020-07-18 ENCOUNTER — Ambulatory Visit
Admission: EM | Admit: 2020-07-18 | Discharge: 2020-07-18 | Disposition: A | Discharge status: Home / Self Care | Payer: 59 | Attending: Emergency Medicine | Admitting: Emergency Medicine

## 2020-07-18 ENCOUNTER — Encounter: Payer: Self-pay | Admitting: Emergency Medicine

## 2020-07-18 DIAGNOSIS — R141 Gas pain: Secondary | ICD-10-CM

## 2020-07-18 DIAGNOSIS — R1084 Generalized abdominal pain: Secondary | ICD-10-CM | POA: Diagnosis not present

## 2020-07-18 DIAGNOSIS — K5900 Constipation, unspecified: Secondary | ICD-10-CM

## 2020-07-18 MED ORDER — POLYETHYLENE GLYCOL 3350 17 G PO PACK: 17.0000 g | PACK | Freq: Every day | ORAL | 0 refills | Status: DC

## 2020-07-18 MED ORDER — ALUM & MAG HYDROXIDE-SIMETH 200-200-20 MG/5ML PO SUSP
30.0000 mL | Freq: Once | ORAL | Status: AC
  Administered 2020-07-18: 30 mL via ORAL

## 2020-07-18 MED ORDER — DICYCLOMINE HCL 20 MG PO TABS: 20.0000 mg | ORAL_TABLET | Freq: Two times a day (BID) | ORAL | 0 refills | Status: AC

## 2020-07-18 NOTE — ED Triage Notes (Signed)
Reports she feels like she has a lot of trapped gas in her abdomen.  Has had a lot of trouble trying to have a bowel movement. Last bm was x 3 days ago and it wasn't a lot.

## 2020-07-18 NOTE — ED Provider Notes (Signed)
[facility]   [id number] 07/18/20 Arrival Time: 0853  CC: ABDOMINAL DISCOMFORT  SUBJECTIVE:  Samantha Bender is a 44 y.o. female who presents with complaint of abdominal discomfort, gas and constipation x 2-3 days.  Denies a precipitating event, trauma, or changes in diet.  Reports abdominal discomfort that is diffuse about the abdomen Describes as intermittent, improving, and dull in character.  Has tried OTC gas-x without relief.  Denies alleviating or aggravating factors.  Reports similar symptoms in the past.  Last BM 3 days ago  Denies fever, chills, nausea, vomiting, chest pain, SOB, diarrhea, hematochezia, melena, dysuria, difficulty urinating, increased frequency or urgency, flank pain, loss of bowel or bladder function, vaginal discharge, vaginal odor, vaginal bleeding, dyspareunia, pelvic pain.     Patient's last menstrual period was 07/04/2020.  ROS: As per HPI.  All other pertinent ROS negative.     Past Medical History:  Diagnosis Date  . Depression   . Hypertension   . Obesity    Past Surgical History:  Procedure Laterality Date  . ANKLE SURGERY     Allergies  Allergen Reactions  . Shellfish Allergy Shortness Of Breath   No current facility-administered medications on file prior to encounter.   Current Outpatient Medications on File Prior to Encounter  Medication Sig Dispense Refill  . amLODipine (NORVASC) 5 MG tablet Take 1 tablet (5 mg total) by mouth daily. 30 tablet 3  . losartan-hydrochlorothiazide (HYZAAR) 100-25 MG tablet Take 1 tablet by mouth daily. 90 tablet 1  . predniSONE (DELTASONE) 20 MG tablet Take 2 tablets (40 mg total) by mouth daily with breakfast. 10 tablet 1  . triamcinolone (KENALOG) 0.025 % cream Apply 1 application topically 2 (two) times daily.     Social History   Socioeconomic History  . Marital status: Married    Spouse name: Not on file  . Number of children: Not on file  . Years of education: Not on file  . Highest  education level: Not on file  Occupational History  . Not on file  Tobacco Use  . Smoking status: Never Smoker  . Smokeless tobacco: Never Used  Substance and Sexual Activity  . Alcohol use: No  . Drug use: No  . Sexual activity: Not on file  Other Topics Concern  . Not on file  Social History Narrative   Married for 15 years.Lives with husband.Works for [employer] ,Research officer, political party.   Social Determinants of Health   Financial Resource Strain:   . Difficulty of Paying Living Expenses: Not on file  Food Insecurity:   . Worried About Programme researcher, broadcasting/film/video in the Last Year: Not on file  . Ran Out of Food in the Last Year: Not on file  Transportation Needs:   . Lack of Transportation (Medical): Not on file  . Lack of Transportation (Non-Medical): Not on file  Physical Activity:   . Days of Exercise per Week: Not on file  . Minutes of Exercise per Session: Not on file  Stress:   . Feeling of Stress : Not on file  Social Connections:   . Frequency of Communication with Friends and Family: Not on file  . Frequency of Social Gatherings with Friends and Family: Not on file  . Attends Religious Services: Not on file  . Active Member of Clubs or Organizations: Not on file  . Attends Banker Meetings: Not on file  . Marital Status: Not on file  Intimate Partner Violence:   . Fear  of Current or Ex-Partner: Not on file  . Emotionally Abused: Not on file  . Physically Abused: Not on file  . Sexually Abused: Not on file   Family History  Problem Relation Age of Onset  . Diabetes Mother   . Cancer Father      OBJECTIVE:  Vitals:   07/18/20 0901 07/18/20 0905  BP: (!) 148/89   Pulse: 96   Resp: 19   Temp: 99.1 F (37.3 C)   TempSrc: Oral   SpO2: 97%   Weight:  255 lb (115.7 kg)  Height:  5\' 6"  (1.676 m)    General appearance: Alert; NAD HEENT: NCAT.  Oropharynx clear.  Lungs: clear to auscultation bilaterally without adventitious breath  sounds Heart: regular rate and rhythm.   Abdomen: soft, non-distended; normal active bowel sounds; diffuse TTP over epigastric region and LLQ; nontender at McBurney's point; negative Murphy's sign; no guarding Back: no CVA tenderness Extremities: no edema; symmetrical with no gross deformities Skin: warm and dry Neurologic: normal gait Psychological: alert and cooperative; normal mood and affect   ASSESSMENT & PLAN:  1. Generalized abdominal discomfort   2. Constipation, unspecified constipation type   3. Abdominal gas pain     Meds ordered this encounter  Medications  . dicyclomine (BENTYL) 20 MG tablet    Sig: Take 1 tablet (20 mg total) by mouth 2 (two) times daily.    Dispense:  20 tablet    Refill:  0    Order Specific Question:   Supervising Provider    Answer:   Eustace Moore  . polyethylene glycol (MIRALAX / GLYCOLAX) 17 g packet    Sig: Take 17 g by mouth daily.    Dispense:  14 each    Refill:  0    Order Specific Question:   Supervising Provider    Answer:   [[id number]] Eustace Moore  . alum & mag hydroxide-simeth (MAALOX/MYLANTA) 200-200-20 MG/5ML suspension 30 mL    Unable to rule out gallbladder disease, diverticulitis, pancreatitis, etc... in urgent care setting.  Offered patient further evaluation and management in the ED.  Patient declines at this time and would like to try outpatient therapy first.  Aware of the risk associated with this decision including missed diagnosis, organ damage, organ failure, and/or death.  Patient aware and in agreement.     GI cocktail given in office Recommend increasing water intake.  Drink at least half your body weight in ounces Increased fiber rich foods in diet (see hand-out) Miralax prescribed.  Use as directed Bentyl prescribed.  Use as directed Follow up with PCP if symptoms persists Return or go to the ED if you have any new or worsening symptoms such as increased abdominal pain, nausea, vomiting, if  you go 3-4 days without bowel movement, chest pain, shortness of breath, abdomen feels hard or distended, etc...   Reviewed expectations re: course of current medical issues. Questions answered. Outlined signs and symptoms indicating need for more acute intervention. Patient verbalized understanding. After Visit Summary given.   03-15-2001, PA-C 07/18/20 [phone]

## 2020-07-18 NOTE — Discharge Instructions (Signed)
Unable to rule out gallbladder disease, diverticulitis, pancreatitis, etc... in urgent care setting.  Offered patient further evaluation and management in the ED.  Patient declines at this time and would like to try outpatient therapy first.  Aware of the risk associated with this decision including missed diagnosis, organ damage, organ failure, and/or death.  Patient aware and in agreement.     GI cocktail given in office Recommend increasing water intake.  Drink at least half your body weight in ounces Increased fiber rich foods in diet (see hand-out) Miralax prescribed.  Use as directed Bentyl prescribed.  Use as directed Follow up with PCP if symptoms persists Return or go to the ED if you have any new or worsening symptoms such as increased abdominal pain, nausea, vomiting, if you go 3-4 days without bowel movement, chest pain, shortness of breath, abdomen feels hard or distended, etc..Marland Kitchen

## 2020-07-23 ENCOUNTER — Other Ambulatory Visit (INDEPENDENT_AMBULATORY_CARE_PROVIDER_SITE_OTHER): Payer: Self-pay | Admitting: Internal Medicine

## 2020-07-24 ENCOUNTER — Encounter: Payer: 59 | Admitting: Obstetrics and Gynecology

## 2020-07-29 ENCOUNTER — Other Ambulatory Visit: Payer: Self-pay

## 2020-07-29 ENCOUNTER — Telehealth (INDEPENDENT_AMBULATORY_CARE_PROVIDER_SITE_OTHER): Payer: Self-pay

## 2020-07-29 ENCOUNTER — Encounter (INDEPENDENT_AMBULATORY_CARE_PROVIDER_SITE_OTHER): Payer: Self-pay | Admitting: Internal Medicine

## 2020-07-29 ENCOUNTER — Ambulatory Visit (INDEPENDENT_AMBULATORY_CARE_PROVIDER_SITE_OTHER): Payer: 59 | Admitting: Internal Medicine

## 2020-07-29 VITALS — BP 144/80 | HR 84 | Temp 97.1°F | Ht 66.0 in | Wt 253.2 lb

## 2020-07-29 DIAGNOSIS — R5381 Other malaise: Secondary | ICD-10-CM

## 2020-07-29 DIAGNOSIS — I10 Essential (primary) hypertension: Secondary | ICD-10-CM | POA: Diagnosis not present

## 2020-07-29 DIAGNOSIS — R5383 Other fatigue: Secondary | ICD-10-CM | POA: Diagnosis not present

## 2020-07-29 NOTE — Telephone Encounter (Signed)
Patient came in for a visit today with Dr. Karilyn Cota and needs to have her extended leave of absense letter or paperwork sent to [facility] Claims and fax to [phone] or can call them at [phone]. Patient stated that you have sent her previous forms and is requesting assistance with this? Sending to you for further documentation.

## 2020-07-29 NOTE — Telephone Encounter (Signed)
Thank you, will work up information now.Then submit to [facility].

## 2020-07-29 NOTE — Progress Notes (Signed)
Metrics: Intervention Frequency ACO  Documented Smoking Status Yearly  Screened one or more times in 24 months  Cessation Counseling or  Active cessation medication Past 24 months  Past 24 months   Guideline developer: UpToDate (See UpToDate for funding source) Date Released: 2014       Wellness Office Visit  Subjective:  Patient ID: Samantha Bender, female    DOB: 1976/05/26  Age: 44 y.o. MRN: [id number]  CC: This lady comes in for follow-up of hypertension, morbid obesity and fatigue and malaise as well as multiple joint pains. HPI  She is still awaiting to see the rheumatologist which will happen in the middle of January next year.  In the meantime, she discontinued amlodipine because of some rash but she does not really think the amlodipine caused a rash.  In the meantime, she has managed to lose further weight. I had also recommended NP thyroid, off label, for symptoms of thyroid hypofunction but she thought she got side effects from this so she discontinued.  She is now willing to try it. Past Medical History:  Diagnosis Date  . Depression   . Hypertension   . Obesity    Past Surgical History:  Procedure Laterality Date  . ANKLE SURGERY       Family History  Problem Relation Age of Onset  . Diabetes Mother   . Cancer Father     Social History   Social History Narrative   Married for 15 years.Lives with husband.Works for [employer] ,Research officer, political party.   Social History   Tobacco Use  . Smoking status: Never Smoker  . Smokeless tobacco: Never Used  Substance Use Topics  . Alcohol use: No    Current Meds  Medication Sig  . amLODipine (NORVASC) 5 MG tablet Take 1 tablet (5 mg total) by mouth daily.  . betamethasone dipropionate 0.05 % cream Apply 1 application topically 2 (two) times daily.  Marland Kitchen dicyclomine (BENTYL) 20 MG tablet Take 1 tablet (20 mg total) by mouth 2 (two) times daily.  Marland Kitchen ketoconazole (NIZORAL) 2 % cream Apply topically.  Marland Kitchen  losartan-hydrochlorothiazide (HYZAAR) 100-25 MG tablet Take 1 tablet by mouth daily.  . polyethylene glycol (MIRALAX / GLYCOLAX) 17 g packet Take 17 g by mouth daily.  . predniSONE (DELTASONE) 20 MG tablet Take 2 tablets (40 mg total) by mouth daily with breakfast.  . triamcinolone (KENALOG) 0.025 % cream Apply 1 application topically 2 (two) times daily.      Depression screen PHQ 2/9 07/29/2020  Decreased Interest 2  Down, Depressed, Hopeless 2  PHQ - 2 Score 4  Altered sleeping 2  Tired, decreased energy 2  Change in appetite 0  Feeling bad or failure about yourself  1  Trouble concentrating 2  Moving slowly or fidgety/restless 0  Suicidal thoughts 0  PHQ-9 Score 11  Difficult doing work/chores Somewhat difficult     Objective:   Today's Vitals: BP (!) 144/80   Pulse 84   Temp (!) 97.1 F (36.2 C) (Temporal)   Ht 5\' 6"  (1.676 m)   Wt 253 lb 3.2 oz (114.9 kg)   LMP 07/04/2020   SpO2 99%   BMI 40.87 kg/m  Vitals with BMI 07/29/2020 07/18/2020 06/12/2020  Height 5\' 6"  5\' 6"  5\' 6"   Weight 253 lbs 3 oz 255 lbs 263 lbs 6 oz  BMI 40.89 41.18 42.53  Systolic 144 148 08/12/2020  Diastolic 80 89 120  Pulse 84 96 81     Physical Exam  Although she remains morbidly obese, she has lost another 10 pounds since the last time I saw her I believe.  Blood pressure is still uncontrolled although better.    Assessment   1. Hypertension, unspecified type   2. Morbid obesity (HCC)   3. Malaise and fatigue       Tests ordered No orders of the defined types were placed in this encounter.    Plan: 1. I recommended that she restart the amlodipine 5 mg daily. 2. She will continue to work on nutrition for her morbid obesity. 3. She will restart NP thyroid 30 mg daily. 4. I think she is not quite ready to go back to work and she will stay out of work until I see her in 2 months time.   No orders of the defined types were placed in this encounter.   Wilson Singer, MD

## 2020-08-14 ENCOUNTER — Ambulatory Visit: Payer: 59 | Admitting: Gastroenterology

## 2020-08-20 ENCOUNTER — Encounter (INDEPENDENT_AMBULATORY_CARE_PROVIDER_SITE_OTHER): Payer: Self-pay | Admitting: Internal Medicine

## 2020-08-20 ENCOUNTER — Other Ambulatory Visit (INDEPENDENT_AMBULATORY_CARE_PROVIDER_SITE_OTHER): Payer: Self-pay | Admitting: Internal Medicine

## 2020-08-25 ENCOUNTER — Other Ambulatory Visit (INDEPENDENT_AMBULATORY_CARE_PROVIDER_SITE_OTHER): Payer: Self-pay | Admitting: Internal Medicine

## 2020-08-29 ENCOUNTER — Other Ambulatory Visit (INDEPENDENT_AMBULATORY_CARE_PROVIDER_SITE_OTHER): Payer: Self-pay | Admitting: Internal Medicine

## 2020-09-07 NOTE — Progress Notes (Deleted)
   Office Visit Note  Patient: Samantha Bender             Date of Birth: [birth year]           MRN: [id number]             PCP: Wilson Singer, MD Referring: Wilson Singer, MD Visit Date: 09/18/2020 Occupation: @GUAROCC @  Subjective:  No chief complaint on file.   History of Present Illness: Samantha Bender is a 45 y.o. female ***   Activities of Daily Living:  Patient reports morning stiffness for *** {minute/hour:19697}.   Patient {ACTIONS;DENIES/REPORTS:21021675::"Denies"} nocturnal pain.  Difficulty dressing/grooming: {ACTIONS;DENIES/REPORTS:21021675::"Denies"} Difficulty climbing stairs: {ACTIONS;DENIES/REPORTS:21021675::"Denies"} Difficulty getting out of chair: {ACTIONS;DENIES/REPORTS:21021675::"Denies"} Difficulty using hands for taps, buttons, cutlery, and/or writing: {ACTIONS;DENIES/REPORTS:21021675::"Denies"}  No Rheumatology ROS completed.   PMFS History:  Patient Active Problem List   Diagnosis Date Noted  . Obesity 04/22/2020  . HTN (hypertension) 04/22/2020  . Rash 04/22/2020    Past Medical History:  Diagnosis Date  . Depression   . Hypertension   . Obesity     Family History  Problem Relation Age of Onset  . Diabetes Mother   . Cancer Father    Past Surgical History:  Procedure Laterality Date  . ANKLE SURGERY     Social History   Social History Narrative   Married for 15 years.Lives with husband.Works for 04/24/2020 ,[employer].   Immunization History  Administered Date(s) Administered  . Moderna Sars-Covid-2 Vaccination 11/17/2019, 12/19/2019     Objective: Vital Signs: There were no vitals taken for this visit.   Physical Exam   Musculoskeletal Exam: ***  CDAI Exam: CDAI Score: -- Patient Global: --; Provider Global: -- Swollen: --; Tender: -- Joint Exam 09/18/2020   No joint exam has been documented for this visit   There is currently no information documented on the homunculus. Go to the Rheumatology  activity and complete the homunculus joint exam.  Investigation: No additional findings.  Imaging: No results found.  Recent Labs: Lab Results  Component Value Date   WBC 3.8 04/22/2020   HGB 12.7 04/22/2020   PLT 219 04/22/2020   NA 140 04/22/2020   K 4.1 04/22/2020   CL 105 04/22/2020   CO2 30 04/22/2020   GLUCOSE 85 04/22/2020   BUN 11 04/22/2020   CREATININE 0.79 04/22/2020   BILITOT 0.4 04/22/2020   ALKPHOS 56 05/07/2010   AST 16 04/22/2020   ALT 13 04/22/2020   PROT 7.5 04/22/2020   ALBUMIN 3.8 05/07/2010   CALCIUM 8.9 04/22/2020   GFRAA 108 04/22/2020    Speciality Comments: No specialty comments available.  Procedures:  No procedures performed Allergies: Shellfish allergy   Assessment / Plan:     Visit Diagnoses: Polyarthralgia - 04/22/20: ANA 1:1280 NS, 1:640 cytoplasmic, RF<14, TSH 2.84, T4 1.1, T3 2.8  Primary hypertension  Rash  Orders: No orders of the defined types were placed in this encounter.  No orders of the defined types were placed in this encounter.   Face-to-face time spent with patient was *** minutes. Greater than 50% of time was spent in counseling and coordination of care.  Follow-Up Instructions: No follow-ups on file.   04/24/20, PA-C  Note - This record has been created using Dragon software.  Chart creation errors have been sought, but may not always  have been located. Such creation errors do not reflect on  the standard of medical care.

## 2020-09-18 ENCOUNTER — Ambulatory Visit: Payer: 59 | Admitting: Rheumatology

## 2020-09-18 DIAGNOSIS — R21 Rash and other nonspecific skin eruption: Secondary | ICD-10-CM

## 2020-09-18 DIAGNOSIS — I10 Essential (primary) hypertension: Secondary | ICD-10-CM

## 2020-09-18 DIAGNOSIS — M2550 Pain in unspecified joint: Secondary | ICD-10-CM

## 2020-09-24 ENCOUNTER — Encounter (INDEPENDENT_AMBULATORY_CARE_PROVIDER_SITE_OTHER): Payer: Self-pay | Admitting: Internal Medicine

## 2020-09-25 ENCOUNTER — Ambulatory Visit (INDEPENDENT_AMBULATORY_CARE_PROVIDER_SITE_OTHER): Payer: 59 | Admitting: Internal Medicine

## 2020-10-06 NOTE — Telephone Encounter (Signed)
Close encounter 

## 2020-10-06 NOTE — Telephone Encounter (Signed)
Closed

## 2020-10-28 ENCOUNTER — Ambulatory Visit (INDEPENDENT_AMBULATORY_CARE_PROVIDER_SITE_OTHER): Payer: Self-pay | Admitting: Internal Medicine

## 2020-11-24 DIAGNOSIS — M329 Systemic lupus erythematosus, unspecified: Secondary | ICD-10-CM | POA: Diagnosis not present

## 2020-11-24 DIAGNOSIS — L920 Granuloma annulare: Secondary | ICD-10-CM | POA: Diagnosis not present

## 2020-11-24 DIAGNOSIS — Z79899 Other long term (current) drug therapy: Secondary | ICD-10-CM | POA: Diagnosis not present

## 2020-11-27 ENCOUNTER — Ambulatory Visit (INDEPENDENT_AMBULATORY_CARE_PROVIDER_SITE_OTHER): Payer: Self-pay | Admitting: Internal Medicine

## 2020-12-15 ENCOUNTER — Telehealth (INDEPENDENT_AMBULATORY_CARE_PROVIDER_SITE_OTHER): Payer: Self-pay

## 2020-12-17 ENCOUNTER — Other Ambulatory Visit: Payer: Self-pay

## 2020-12-17 ENCOUNTER — Telehealth (INDEPENDENT_AMBULATORY_CARE_PROVIDER_SITE_OTHER): Payer: BC Managed Care – PPO | Admitting: Nurse Practitioner

## 2020-12-17 ENCOUNTER — Encounter (INDEPENDENT_AMBULATORY_CARE_PROVIDER_SITE_OTHER): Payer: Self-pay | Admitting: Nurse Practitioner

## 2020-12-17 VITALS — BP 133/91 | HR 61 | Temp 96.8°F | Ht 66.0 in | Wt 251.8 lb

## 2020-12-17 DIAGNOSIS — F331 Major depressive disorder, recurrent, moderate: Secondary | ICD-10-CM | POA: Diagnosis not present

## 2020-12-17 DIAGNOSIS — F419 Anxiety disorder, unspecified: Secondary | ICD-10-CM | POA: Diagnosis not present

## 2020-12-17 MED ORDER — ESCITALOPRAM OXALATE 5 MG PO TABS: ORAL_TABLET | ORAL | 1 refills | Status: DC

## 2020-12-17 MED ORDER — ALPRAZOLAM 0.25 MG PO TABS: 0.2500 mg | ORAL_TABLET | Freq: Every day | ORAL | 0 refills | Status: AC | PRN

## 2020-12-17 NOTE — Patient Instructions (Signed)
Escitalopram Tablets What is this medicine? ESCITALOPRAM (es sye TAL oh pram) is used to treat depression and certain types of anxiety. This medicine may be used for other purposes; ask your health care provider or pharmacist if you have questions. COMMON BRAND NAME(S): Lexapro What should I tell my health care provider before I take this medicine? They need to know if you have any of these conditions:  bipolar disorder or a family history of bipolar disorder  diabetes  glaucoma  heart disease  kidney or liver disease  receiving electroconvulsive therapy  seizures (convulsions)  suicidal thoughts, plans, or attempt by you or a family member  an unusual or allergic reaction to escitalopram, the related drug citalopram, other medicines, foods, dyes, or preservatives  pregnant or trying to become pregnant  breast-feeding How should I use this medicine? Take this medicine by mouth with a glass of water. Follow the directions on the prescription label. You can take it with or without food. If it upsets your stomach, take it with food. Take your medicine at regular intervals. Do not take it more often than directed. Do not stop taking this medicine suddenly except upon the advice of your doctor. Stopping this medicine too quickly may cause serious side effects or your condition may worsen. A special MedGuide will be given to you by the pharmacist with each prescription and refill. Be sure to read this information carefully each time. Talk to your pediatrician regarding the use of this medicine in children. Special care may be needed. Overdosage: If you think you have taken too much of this medicine contact a poison control center or emergency room at once. NOTE: This medicine is only for you. Do not share this medicine with others. What if I miss a dose? If you miss a dose, take it as soon as you can. If it is almost time for your next dose, take only that dose. Do not take double or  extra doses. What may interact with this medicine? Do not take this medicine with any of the following medications:  certain medicines for fungal infections like fluconazole, itraconazole, ketoconazole, posaconazole, voriconazole  cisapride  citalopram  dronedarone  linezolid  MAOIs like Carbex, Eldepryl, Marplan, Nardil, and Parnate  methylene blue (injected into a vein)  pimozide  thioridazine This medicine may also interact with the following medications:  alcohol  amphetamines  aspirin and aspirin-like medicines  carbamazepine  certain medicines for depression, anxiety, or psychotic disturbances  certain medicines for migraine headache like almotriptan, eletriptan, frovatriptan, naratriptan, rizatriptan, sumatriptan, zolmitriptan  certain medicines for sleep  certain medicines that treat or prevent blood clots like warfarin, enoxaparin, dalteparin  cimetidine  diuretics  dofetilide  fentanyl  furazolidone  isoniazid  lithium  metoprolol  NSAIDs, medicines for pain and inflammation, like ibuprofen or naproxen  other medicines that prolong the QT interval (cause an abnormal heart rhythm)  procarbazine  rasagiline  supplements like St. John's wort, kava kava, valerian  tramadol  tryptophan  ziprasidone This list may not describe all possible interactions. Give your health care provider a list of all the medicines, herbs, non-prescription drugs, or dietary supplements you use. Also tell them if you smoke, drink alcohol, or use illegal drugs. Some items may interact with your medicine. What should I watch for while using this medicine? Tell your doctor if your symptoms do not get better or if they get worse. Visit your doctor or health care professional for regular checks on your progress. Because it may   take several weeks to see the full effects of this medicine, it is important to continue your treatment as prescribed by your doctor. Patients  and their families should watch out for new or worsening thoughts of suicide or depression. Also watch out for sudden changes in feelings such as feeling anxious, agitated, panicky, irritable, hostile, aggressive, impulsive, severely restless, overly excited and hyperactive, or not being able to sleep. If this happens, especially at the beginning of treatment or after a change in dose, call your health care professional. Bonita Quin may get drowsy or dizzy. Do not drive, use machinery, or do anything that needs mental alertness until you know how this medicine affects you. Do not stand or sit up quickly, especially if you are an older patient. This reduces the risk of dizzy or fainting spells. Alcohol may interfere with the effect of this medicine. Avoid alcoholic drinks. Your mouth may get dry. Chewing sugarless gum or sucking hard candy, and drinking plenty of water may help. Contact your doctor if the problem does not go away or is severe. What side effects may I notice from receiving this medicine? Side effects that you should report to your doctor or health care professional as soon as possible:  allergic reactions like skin rash, itching or hives, swelling of the face, lips, or tongue  anxious  black, tarry stools  changes in vision  confusion  elevated mood, decreased need for sleep, racing thoughts, impulsive behavior  eye pain  fast, irregular heartbeat  feeling faint or lightheaded, falls  feeling agitated, angry, or irritable  hallucination, loss of contact with reality  loss of balance or coordination  loss of memory  painful or prolonged erections  restlessness, pacing, inability to keep still  seizures  stiff muscles  suicidal thoughts or other mood changes  trouble sleeping  unusual bleeding or bruising  unusually weak or tired  vomiting Side effects that usually do not require medical attention (report to your doctor or health care professional if they  continue or are bothersome):  changes in appetite  change in sex drive or performance  headache  increased sweating  indigestion, nausea  tremors This list may not describe all possible side effects. Call your doctor for medical advice about side effects. You may report side effects to FDA at 1-800-FDA-1088. Where should I keep my medicine? Keep out of reach of children. Store at room temperature between 15 and 30 degrees C (59 and 86 degrees F). Throw away any unused medicine after the expiration date. NOTE: This sheet is a summary. It may not cover all possible information. If you have questions about this medicine, talk to your doctor, pharmacist, or health care provider.  2021 Elsevier/Gold Standard (2020-07-14 09:53:34)   Alprazolam tablets What is this medicine? ALPRAZOLAM (al PRAY zoe lam) is a benzodiazepine. It is used to treat anxiety and panic attacks. This medicine may be used for other purposes; ask your health care provider or pharmacist if you have questions. COMMON BRAND NAME(S): Xanax What should I tell my health care provider before I take this medicine? They need to know if you have any of these conditions:  depression or other mental health disease  history of alcohol or medicine abuse or addiction  kidney disease  liver disease  lung disease, asthma, or breathing problem  seizures  suicidal thoughts, plans or attempt  an unusual or allergic reaction to alprazolam, other benzodiazepines, foods, dyes, or preservatives  pregnant or trying to get pregnant  breast-feeding How  should I use this medicine? Take this medicine by mouth. Take it as directed on the prescription label. Do not take it more often than directed. Keep taking it unless your health care provider tells you to stop. A special MedGuide will be given to you by the pharmacist with each prescription and refill. Be sure to read this information carefully each time. Talk to your health  care provider about the use of this medicine in children. Special care may be needed. Patients over 7 years of age may have a stronger reaction and need a smaller dose. Overdosage: If you think you have taken too much of this medicine contact a poison control center or emergency room at once. NOTE: This medicine is only for you. Do not share this medicine with others. What if I miss a dose? If you miss a dose, take it as soon as you can. If it is almost time for your next dose, take only that dose. Do not take double or extra doses. What may interact with this medicine? Do not take this medicine with any of the following medications:  certain antivirals for HIV or hepatitis  certain medicines for fungal infections like ketoconazole, itraconazole, or posaconazole  clarithromycin  grapefruit juice  narcotic medicines for cough  sodium oxybate This medicine may also interact with the following medications:  alcohol  antihistamines for allergy, cough and cold  certain medicines for anxiety or sleep  certain medicines for depression like amitriptyline, fluoxetine, fluvoxamine, nefazodone, sertraline  certain medicines for seizures like carbamazepine, phenobarbital, phenytoin, primidone  cimetidine  digoxin  erythromycin  female hormones, like estrogens or progestins and birth control pills, patches, rings, or injections  general anesthetics like halothane, isoflurane, methoxyflurane, propofol  medicines that relax muscles  narcotic medicines for pain  phenothiazines like chlorpromazine, mesoridazine, prochlorperazine, thioridazine This list may not describe all possible interactions. Give your health care provider a list of all the medicines, herbs, non-prescription drugs, or dietary supplements you use. Also tell them if you smoke, drink alcohol, or use illegal drugs. Some items may interact with your medicine. What should I watch for while using this medicine? Visit  your health care provider for regular checks on your progress. Tell your health care provider if your symptoms do not start to get better or if they get worse. Do not stop taking except on your doctor's advice. You may develop a severe reaction. Your doctor will tell you how much medicine to take. You may get drowsy or dizzy. Do not drive, use machinery, or do anything that needs mental alertness until you know how this medicine affects you. To reduce the risk of dizzy and fainting spells, do not stand or sit up quickly, especially if you are an older patient. Alcohol may increase dizziness and drowsiness. Avoid alcoholic drinks. If you are taking another medicine that also causes drowsiness, you may have more side effects. Give your health care provider a list of all medicines you use. Your doctor will tell you how much medicine to take. Do not take more medicine than directed. Call emergency for help if you have problems breathing or unusual sleepiness. Women should inform their health care provider if they wish to become pregnant or think they might be pregnant. Do not breast-feed while taking this medicine. Talk to your health care provider for more information. What side effects may I notice from receiving this medicine? Side effects that you should report to your doctor or health care professional as soon as  possible:  allergic reactions like skin rash, itching or hives, swelling of the face, lips, or tongue  confusion  loss of balance or coordination  signs and symptoms of low blood pressure like dizziness; feeling faint or lightheaded, falls; unusually weak or tired  suicidal thoughts or other mood changes  trouble breathing  trouble saying things clearly Side effects that usually do not require medical attention (report to your doctor or health care professional if they continue or are bothersome):  changes in sex drive  drowsiness  dry mouth  tiredness This list may not  describe all possible side effects. Call your doctor for medical advice about side effects. You may report side effects to FDA at 1-800-FDA-1088. Where should I keep my medicine? Keep out of the reach of children and pets. This medicine can be abused. Keep it in a safe place to protect it from theft. Do not share it with anyone. It is only for you. Selling or giving away this medicine is dangerous and against the law. Store at room temperature between 20 and 25 degrees C (68 and 77 degrees F). Get rid of any unused medicine after the expiration date. This medicine may cause harm and death if it is taken by other adults, children, or pets. It is important to get rid of the medicine as soon as you no longer need it or it is expired. You can do this in two ways:  Take the medicine to a medicine take-back program. Check with your pharmacy or law enforcement to find a location.  If you cannot return the medicine, check the label or package insert to see if the medicine should be thrown out in the garbage or flushed down the toilet. If you are not sure, ask your health care provider. If it is safe to put it in the trash, take the medicine out of the container. Mix the medicine with cat litter, dirt, coffee grounds, or other unwanted substance. Seal the mixture in a bag or container. Put it in the trash. NOTE: This sheet is a summary. It may not cover all possible information. If you have questions about this medicine, talk to your doctor, pharmacist, or health care provider.  2021 Elsevier/Gold Standard (2020-02-25 15:56:13)

## 2020-12-17 NOTE — Progress Notes (Signed)
Due to national recommendations of social distancing related to the COVID19 pandemic, an audio-only tele-health visit was felt to be the most appropriate encounter type for this patient today. I connected with  Samantha Bender on 12/17/20 utilizing audio-only technology and verified that I am speaking with the correct person using two identifiers. The patient was located at their home, and I was located at the office of [facility] during the encounter. I discussed the limitations of evaluation and management by telemedicine. The patient expressed understanding and agreed to proceed.    Subjective:  Patient ID: Samantha Bender, female    DOB: November 26, 1975  Age: 45 y.o. MRN: [id number]  CC:  Chief Complaint  Patient presents with  . Anxiety    Discuss anxiety      HPI  This patient arrives today for a virtual visit for the above.  She tells me she is been having anxiety and depression probably since 2018 when she lost her her father who passed away at that time.  She tells me that she is kind of just been biting her depressed mood and has been trying to improve it with focusing on daily devotional's and working out.  Over the last year or so she has been feeling worse with her mood and has been seeing a therapist.  Unfortunately she had to discontinue seeing her therapist back in December due to insurance no longer covering her therapy.  She tells me that she thinks insurance can start to cover again so she plans on restarting her therapy.  She tells me she starting to experience more anxiety and sometimes feels panic attacks.  She is been feeling really down and even has been isolating herself and not wanting to spend time with friends or family.  She was prescribed Prozac in the past by a previous provider she tells me, but she never took it because she feels that she was in denial about her moods.  She denies suicidal ideation today.  Past Medical History:  Diagnosis Date  .  Depression   . Hypertension   . Lupus (HCC)   . Obesity       Family History  Problem Relation Age of Onset  . Diabetes Mother   . Cancer Father     Social History   Social History Narrative   Married for 15 years.Lives with husband.Works for [employer] ,Research officer, political party.   Social History   Tobacco Use  . Smoking status: Never Smoker  . Smokeless tobacco: Never Used  Substance Use Topics  . Alcohol use: No     Current Meds  Medication Sig  . amLODipine (NORVASC) 5 MG tablet TAKE 1 TABLET BY MOUTH EVERY DAY  . folic acid (FOLVITE) 1 MG tablet Take 1 mg by mouth daily.  . hydroxychloroquine (PLAQUENIL) 200 MG tablet Take 200 mg by mouth 2 (two) times daily.  Marland Kitchen losartan-hydrochlorothiazide (HYZAAR) 100-25 MG tablet Take 1 tablet by mouth daily.  . methotrexate (RHEUMATREX) 2.5 MG tablet Take by mouth.  . predniSONE (DELTASONE) 20 MG tablet TAKE 2 TABLETS BY MOUTH ONCE DAILY WITH BREAKFAST    ROS:  Review of Systems  Eyes: Negative for blurred vision.  Respiratory: Negative for shortness of breath.   Cardiovascular: Negative for chest pain.  Neurological: Positive for headaches (slight). Negative for dizziness.  Psychiatric/Behavioral: Positive for depression. Negative for suicidal ideas. The patient is nervous/anxious.      Objective:   Today's Vitals: BP (!) 133/91  Pulse 61   Temp (!) 96.8 F (36 C) (Oral)   Ht 5\' 6"  (1.676 m)   Wt 251 lb 12.8 oz (114.2 kg)   LMP 11/28/2020   BMI 40.64 kg/m  Vitals with BMI 12/17/2020 07/29/2020 07/18/2020  Height 5\' 6"  5\' 6"  5\' 6"   Weight 251 lbs 13 oz 253 lbs 3 oz 255 lbs  BMI 40.66 40.89 41.18  Systolic 133 144 13/08/2020  Diastolic 91 80 89  Pulse 61 84 96     Physical Exam Comprehensive physical exam not completed today as office visit was conducted remotely.  Patient sounded well over the phone.  Patient was alert and oriented, and appeared to have appropriate judgment.    PHQ9 SCORE ONLY 12/17/2020  07/29/2020  PHQ-9 Total Score 9 11   GAD 7 : Generalized Anxiety Score 12/17/2020  Nervous, Anxious, on Edge 2  Control/stop worrying 2  Worry too much - different things 2  Trouble relaxing 1  Restless 0  Easily annoyed or irritable 3  Afraid - awful might happen 1  Total GAD 7 Score 11        Assessment and Plan   1. Anxiety   2. Moderate episode of recurrent major depressive disorder (HCC)      Plan: 1.,  2.  We had a long discussion regarding treatment options and per shared decision making she would like to try daily medication.  She does mention that she has a flight coming up in 2 weeks and she is very anxious about that flight.  She is wondering if this medicine will kick in in time for the flight.  I told her that I would prefer to start her on Lexapro which may or may not help her by the time of flight is coming up so we will also give her a short course alprazolam that she can take as needed specifically for the flight that she has coming up.  We did discuss possible side effects of Lexapro and specifically if she were to experience any suicidal ideation that she is to call the office.  She expressed understanding.  Right before ending the conversation she mentioned to me she is has what she thinks is a swollen gland near her ear and is painful.  This is been going on for 4 days.  She said it did get better with warm compress earlier this week.  For now I recommend she continue to use warm compress but if the symptoms persist or do not improve she should call her office first thing next week and will try to get her in for a in person visit for further evaluation.  She also mentions some abdominal upset that is been going on for a while and I encouraged her to either come in for an acute visit for that so we can have a physical evaluation completed or discuss this at her next follow-up.  She has elected to discuss with the follow-up in about 1 month.   Tests ordered No orders  of the defined types were placed in this encounter.     No orders of the defined types were placed in this encounter.   Patient to follow-up in 1 month to see how she is tolerating the Lexapro, or sooner as needed.  Total time spent on telephone today was 24 minutes.  575, NP

## 2020-12-23 ENCOUNTER — Ambulatory Visit (INDEPENDENT_AMBULATORY_CARE_PROVIDER_SITE_OTHER): Payer: Self-pay | Admitting: Internal Medicine

## 2021-01-13 ENCOUNTER — Other Ambulatory Visit (INDEPENDENT_AMBULATORY_CARE_PROVIDER_SITE_OTHER): Payer: Self-pay | Admitting: Nurse Practitioner

## 2021-01-13 DIAGNOSIS — F419 Anxiety disorder, unspecified: Secondary | ICD-10-CM

## 2021-01-19 ENCOUNTER — Encounter (INDEPENDENT_AMBULATORY_CARE_PROVIDER_SITE_OTHER): Payer: Self-pay

## 2021-01-20 ENCOUNTER — Ambulatory Visit (INDEPENDENT_AMBULATORY_CARE_PROVIDER_SITE_OTHER): Payer: Self-pay | Admitting: Internal Medicine

## 2021-01-26 DIAGNOSIS — Z79899 Other long term (current) drug therapy: Secondary | ICD-10-CM | POA: Diagnosis not present

## 2021-01-26 DIAGNOSIS — M329 Systemic lupus erythematosus, unspecified: Secondary | ICD-10-CM | POA: Diagnosis not present

## 2021-02-16 ENCOUNTER — Ambulatory Visit (INDEPENDENT_AMBULATORY_CARE_PROVIDER_SITE_OTHER): Payer: Self-pay | Admitting: Internal Medicine

## 2021-03-10 ENCOUNTER — Telehealth (INDEPENDENT_AMBULATORY_CARE_PROVIDER_SITE_OTHER): Payer: Self-pay

## 2021-03-10 DIAGNOSIS — I10 Essential (primary) hypertension: Secondary | ICD-10-CM

## 2021-03-10 MED ORDER — LOSARTAN POTASSIUM-HCTZ 100-25 MG PO TABS: 1.0000 | ORAL_TABLET | Freq: Every day | ORAL | 0 refills | Status: DC

## 2021-03-10 NOTE — Telephone Encounter (Signed)
Patient called and left a voice message that she is out of her Losartan.   I called the patient back and she needs this refill sent to [facility]:  losartan-hydrochlorothiazide (HYZAAR) 100-25 MG tablet  Last filled 06/30/2020, # 90 with 1 refill  Last OV 12/17/2020  Next OV 03/24/2021

## 2021-03-24 ENCOUNTER — Other Ambulatory Visit (INDEPENDENT_AMBULATORY_CARE_PROVIDER_SITE_OTHER): Payer: Self-pay | Admitting: Nurse Practitioner

## 2021-03-24 ENCOUNTER — Ambulatory Visit (INDEPENDENT_AMBULATORY_CARE_PROVIDER_SITE_OTHER): Payer: BC Managed Care – PPO | Admitting: Internal Medicine

## 2021-03-24 DIAGNOSIS — I10 Essential (primary) hypertension: Secondary | ICD-10-CM

## 2021-03-30 ENCOUNTER — Encounter (INDEPENDENT_AMBULATORY_CARE_PROVIDER_SITE_OTHER): Payer: Self-pay | Admitting: Internal Medicine

## 2021-03-30 ENCOUNTER — Other Ambulatory Visit: Payer: Self-pay

## 2021-03-30 ENCOUNTER — Ambulatory Visit (INDEPENDENT_AMBULATORY_CARE_PROVIDER_SITE_OTHER): Payer: BC Managed Care – PPO | Admitting: Internal Medicine

## 2021-03-30 VITALS — BP 134/86 | HR 69 | Temp 97.3°F | Ht 66.0 in | Wt 248.6 lb

## 2021-03-30 DIAGNOSIS — F419 Anxiety disorder, unspecified: Secondary | ICD-10-CM | POA: Diagnosis not present

## 2021-03-30 MED ORDER — ESCITALOPRAM OXALATE 10 MG PO TABS: 20.0000 mg | ORAL_TABLET | Freq: Every day | ORAL | 3 refills | Status: AC

## 2021-03-30 NOTE — Progress Notes (Signed)
Metrics: Intervention Frequency ACO  Documented Smoking Status Yearly  Screened one or more times in 24 months  Cessation Counseling or  Active cessation medication Past 24 months  Past 24 months   Guideline developer: UpToDate (See UpToDate for funding source) Date Released: 2014       Wellness Office Visit  Subjective:  Patient ID: Samantha Bender, female    DOB: 01-28-1976  Age: 45 y.o. MRN: [id number]  CC: Anxiety/depression. HPI This lady comes in for follow-up.  She has been diagnosed with SLE and is on specific medications, including prednisone.  She has lost weight since the last couple of weeks because she has been doing intermittent fasting but her anxiety/sadness has not improved since being on Lexapro 10 mg daily.  She is definitely not suicidal and she readily tells me this today.  Past Medical History:  Diagnosis Date   Depression    Hypertension    Lupus (HCC)    Obesity    Past Surgical History:  Procedure Laterality Date   ANKLE SURGERY       Family History  Problem Relation Age of Onset   Diabetes Mother    Cancer Father     Social History   Social History Narrative   Married for 15 years.Lives with husband.Works for [employer] ,Research officer, political party.   Social History   Tobacco Use   Smoking status: Never   Smokeless tobacco: Never  Substance Use Topics   Alcohol use: No    Current Meds  Medication Sig   ALPRAZolam (XANAX) 0.25 MG tablet Take 1-2 tablets (0.25-0.5 mg total) by mouth daily as needed for anxiety.   amLODipine (NORVASC) 5 MG tablet TAKE 1 TABLET BY MOUTH EVERY DAY   dicyclomine (BENTYL) 20 MG tablet Take 1 tablet (20 mg total) by mouth 2 (two) times daily.   escitalopram (LEXAPRO) 10 MG tablet Take 2 tablets (20 mg total) by mouth daily.   folic acid (FOLVITE) 1 MG tablet Take 1 mg by mouth daily.   hydroxychloroquine (PLAQUENIL) 200 MG tablet Take 200 mg by mouth 2 (two) times daily.   ketoconazole (NIZORAL) 2 % cream  Apply topically.   losartan-hydrochlorothiazide (HYZAAR) 100-25 MG tablet TAKE 1 TABLET BY MOUTH EVERY DAY   methotrexate (RHEUMATREX) 2.5 MG tablet Take by mouth.   predniSONE (DELTASONE) 20 MG tablet TAKE 2 TABLETS BY MOUTH ONCE DAILY WITH BREAKFAST   [DISCONTINUED] betamethasone dipropionate 0.05 % cream Apply 1 application topically 2 (two) times daily.   [DISCONTINUED] escitalopram (LEXAPRO) 5 MG tablet Take 2 tablets (10 mg total) by mouth daily.   [DISCONTINUED] hydroxychloroquine (PLAQUENIL) 200 MG tablet Take 1 tablet by mouth 2 (two) times daily.   [DISCONTINUED] polyethylene glycol (MIRALAX / GLYCOLAX) 17 g packet Take 17 g by mouth daily.   [DISCONTINUED] triamcinolone (KENALOG) 0.025 % cream Apply 1 application topically 2 (two) times daily.     Flowsheet Row Telemedicine from 12/17/2020 in [facility]  PHQ-9 Total Score 9       Objective:   Today's Vitals: BP 134/86   Pulse 69   Temp (!) 97.3 F (36.3 C) (Temporal)   Ht 5\' 6"  (1.676 m)   Wt 248 lb 9.6 oz (112.8 kg)   SpO2 99%   BMI 40.13 kg/m  Vitals with BMI 03/30/2021 12/17/2020 07/29/2020  Height 5\' 6"  5\' 6"  5\' 6"   Weight 248 lbs 10 oz 251 lbs 13 oz 253 lbs 3 oz  BMI 40.14 40.66 40.89  Systolic 134  133 144  Diastolic 86 91 80  Pulse 69 61 84     Physical Exam She is morbidly obese.  She has lost 3 pounds since the last visit in April.      Assessment   1. Anxiety   2. Morbid obesity (HCC)       Tests ordered No orders of the defined types were placed in this encounter.    Plan: 1.  I am going to increase the dose of her Lexapro so that she is taking 20 mg daily. 2.  I recommended that she do intermittent fasting for slightly longer, 18 hours instead of 16 and she should add apple cider vinegar to her water to achieve this.  She will try to do this. 3.  I told her that should she feel suicidal or her husband noticed anything strange in terms of behavior, we should be alerted.  I  mention to her that steroids can have psychotic side effects. 4.  I will see her in 3 months time for follow-up.    Meds ordered this encounter  Medications   escitalopram (LEXAPRO) 10 MG tablet    Sig: Take 2 tablets (20 mg total) by mouth daily.    Dispense:  60 tablet    Refill:  3     Samantha Sliva Normajean Glasgow, MD

## 2021-04-03 ENCOUNTER — Encounter (INDEPENDENT_AMBULATORY_CARE_PROVIDER_SITE_OTHER): Payer: Self-pay | Admitting: Internal Medicine

## 2021-05-20 ENCOUNTER — Other Ambulatory Visit
Admission: RE | Admit: 2021-05-20 | Discharge: 2021-05-20 | Disposition: A | Discharge status: Home / Self Care | Payer: BC Managed Care – PPO | Source: Ambulatory Visit | Attending: Rheumatology | Admitting: Rheumatology

## 2021-05-20 DIAGNOSIS — M329 Systemic lupus erythematosus, unspecified: Secondary | ICD-10-CM | POA: Diagnosis not present

## 2021-05-20 DIAGNOSIS — R079 Chest pain, unspecified: Secondary | ICD-10-CM | POA: Insufficient documentation

## 2021-05-20 DIAGNOSIS — Z79899 Other long term (current) drug therapy: Secondary | ICD-10-CM | POA: Diagnosis not present

## 2021-05-20 LAB — TROPONIN I (HIGH SENSITIVITY): Troponin I (High Sensitivity): 5 ng/L (ref <18)

## 2021-06-01 ENCOUNTER — Ambulatory Visit
Admission: EM | Admit: 2021-06-01 | Discharge: 2021-06-01 | Disposition: A | Discharge status: Home / Self Care | Payer: BC Managed Care – PPO | Attending: Physician Assistant | Admitting: Physician Assistant

## 2021-06-01 ENCOUNTER — Other Ambulatory Visit: Payer: Self-pay

## 2021-06-01 DIAGNOSIS — B349 Viral infection, unspecified: Secondary | ICD-10-CM

## 2021-06-01 NOTE — ED Triage Notes (Signed)
Pt presents with complaints of cold chills and body aches since last night.

## 2021-06-01 NOTE — ED Provider Notes (Signed)
[facility]    CSN: [id number] Arrival date & time: 06/01/21  1743      History   Chief Complaint Chief Complaint  Patient presents with   bodyaches    HPI Samantha Bender is a 45 y.o. female.   The history is provided by the patient. No language interpreter was used.  Cough Cough characteristics:  Non-productive Sputum characteristics:  Nondescript Severity:  Moderate Onset quality:  Gradual Duration:  1 day Timing:  Constant Progression:  Worsening Chronicity:  New Smoker: no   Relieved by:  Nothing Ineffective treatments:  None tried Associated symptoms: no shortness of breath    Past Medical History:  Diagnosis Date   Depression    Hypertension    Lupus (HCC)    Obesity     Patient Active Problem List   Diagnosis Date Noted   Obesity 04/22/2020   HTN (hypertension) 04/22/2020   Rash 04/22/2020    Past Surgical History:  Procedure Laterality Date   ANKLE SURGERY      OB History   No obstetric history on file.      Home Medications    Prior to Admission medications   Medication Sig Start Date End Date Taking? Authorizing Provider  ALPRAZolam (XANAX) 0.25 MG tablet Take 1-2 tablets (0.25-0.5 mg total) by mouth daily as needed for anxiety. 12/17/20   Elenore Paddy, NP  amLODipine (NORVASC) 5 MG tablet TAKE 1 TABLET BY MOUTH EVERY DAY 08/25/20   Elenore Paddy, NP  dicyclomine (BENTYL) 20 MG tablet Take 1 tablet (20 mg total) by mouth 2 (two) times daily. 07/18/20   Wurst, [place], PA-C  escitalopram (LEXAPRO) 10 MG tablet Take 2 tablets (20 mg total) by mouth daily. 03/30/21   Wilson Singer, MD  folic acid (FOLVITE) 1 MG tablet Take 1 mg by mouth daily. 08/28/20   [provider]  hydroxychloroquine (PLAQUENIL) 200 MG tablet Take 200 mg by mouth 2 (two) times daily. 11/19/20   [provider]  ketoconazole (NIZORAL) 2 % cream Apply topically. 07/08/20   [provider]  losartan-hydrochlorothiazide (HYZAAR)  100-25 MG tablet TAKE 1 TABLET BY MOUTH EVERY DAY 03/24/21   Wilson Singer, MD  methotrexate (RHEUMATREX) 2.5 MG tablet Take by mouth. 11/24/20   [provider]  predniSONE (DELTASONE) 20 MG tablet TAKE 2 TABLETS BY MOUTH ONCE DAILY WITH BREAKFAST 08/29/20   Wilson Singer, MD    Family History Family History  Problem Relation Age of Onset   Diabetes Mother    Cancer Father     Social History Social History   Tobacco Use   Smoking status: Never   Smokeless tobacco: Never  Vaping Use   Vaping Use: Never used  Substance Use Topics   Alcohol use: No   Drug use: No     Allergies   Shellfish allergy   Review of Systems Review of Systems  Respiratory:  Positive for cough. Negative for shortness of breath.   All other systems reviewed and are negative.   Physical Exam Triage Vital Signs ED Triage Vitals [06/01/21 1935]  Enc Vitals Group     BP (!) 172/96     Pulse Rate 88     Resp 19     Temp 98.5 F (36.9 C)     Temp src      SpO2 98 %     Weight      Height      Head Circumference  Peak Flow      Pain Score 5     Pain Loc      Pain Edu?      Excl. in GC?    No data found.  Updated Vital Signs BP (!) 172/96   Pulse 88   Temp 98.5 F (36.9 C)   Resp 19   SpO2 98%   Visual Acuity Right Eye Distance:   Left Eye Distance:   Bilateral Distance:    Right Eye Near:   Left Eye Near:    Bilateral Near:     Physical Exam Vitals and nursing note reviewed.  Constitutional:      Appearance: She is well-developed.  HENT:     Head: Normocephalic.  Cardiovascular:     Rate and Rhythm: Normal rate.  Pulmonary:     Effort: Pulmonary effort is normal.  Abdominal:     General: There is no distension.  Musculoskeletal:        General: Normal range of motion.     Cervical back: Normal range of motion.  Neurological:     Mental Status: She is alert and oriented to person, place, and time.     UC Treatments / Results  Labs (all  labs ordered are listed, but only abnormal results are displayed) Labs Reviewed  COVID-19, FLU A+B NAA    EKG   Radiology No results found.  Procedures Procedures (including critical care time)  Medications Ordered in UC Medications - No data to display  Initial Impression / Assessment and Plan / UC Course  I have reviewed the triage vital signs and the nursing notes.  Pertinent labs & imaging results that were available during my care of the patient were reviewed by me and considered in my medical decision making (see chart for details).     MDM:  covid and flu pending.  Pt advised tylenol every 4 hours Final Clinical Impressions(s) / UC Diagnoses   Final diagnoses:  Viral illness   Discharge Instructions   None    ED Prescriptions   None    PDMP not reviewed this encounter. An After Visit Summary was printed and given to the patient.    Elson Areas, [place] 06/01/21 1955

## 2021-06-02 LAB — COVID-19, FLU A+B NAA
Influenza A, NAA: NOT DETECTED
Influenza B, NAA: NOT DETECTED
SARS-CoV-2, NAA: DETECTED — AB

## 2021-06-19 DIAGNOSIS — R072 Precordial pain: Secondary | ICD-10-CM | POA: Diagnosis not present

## 2021-06-19 DIAGNOSIS — R079 Chest pain, unspecified: Secondary | ICD-10-CM | POA: Diagnosis not present

## 2021-07-06 ENCOUNTER — Ambulatory Visit (INDEPENDENT_AMBULATORY_CARE_PROVIDER_SITE_OTHER): Payer: BC Managed Care – PPO | Admitting: Internal Medicine

## 2021-08-06 DIAGNOSIS — Z7960 Long term (current) use of unspecified immunomodulators and immunosuppressants: Secondary | ICD-10-CM | POA: Diagnosis not present

## 2021-08-06 DIAGNOSIS — M329 Systemic lupus erythematosus, unspecified: Secondary | ICD-10-CM | POA: Diagnosis not present
